# Patient Record
Sex: Female | Born: 1995
Health system: Southern US, Community
[De-identification: ages and names within clinical notes are randomized; demographics above are authoritative.]

## PROBLEM LIST (undated history)

## (undated) DIAGNOSIS — N83209 Unspecified ovarian cyst, unspecified side: Secondary | ICD-10-CM

## (undated) DIAGNOSIS — I7300 Raynaud's syndrome without gangrene: Secondary | ICD-10-CM

## (undated) DIAGNOSIS — N200 Calculus of kidney: Secondary | ICD-10-CM

---
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
## 2013-04-07 IMAGING — CR DG SHOULDER 2+V*L*
3 series · 3 of 3 positions shown · non-contrast
Comparison: None.

CLINICAL DATA: Left shoulder pain.  No injury.

EXAM:
LEFT SHOULDER - 2+ VIEW

[w shoulder ap internal left]
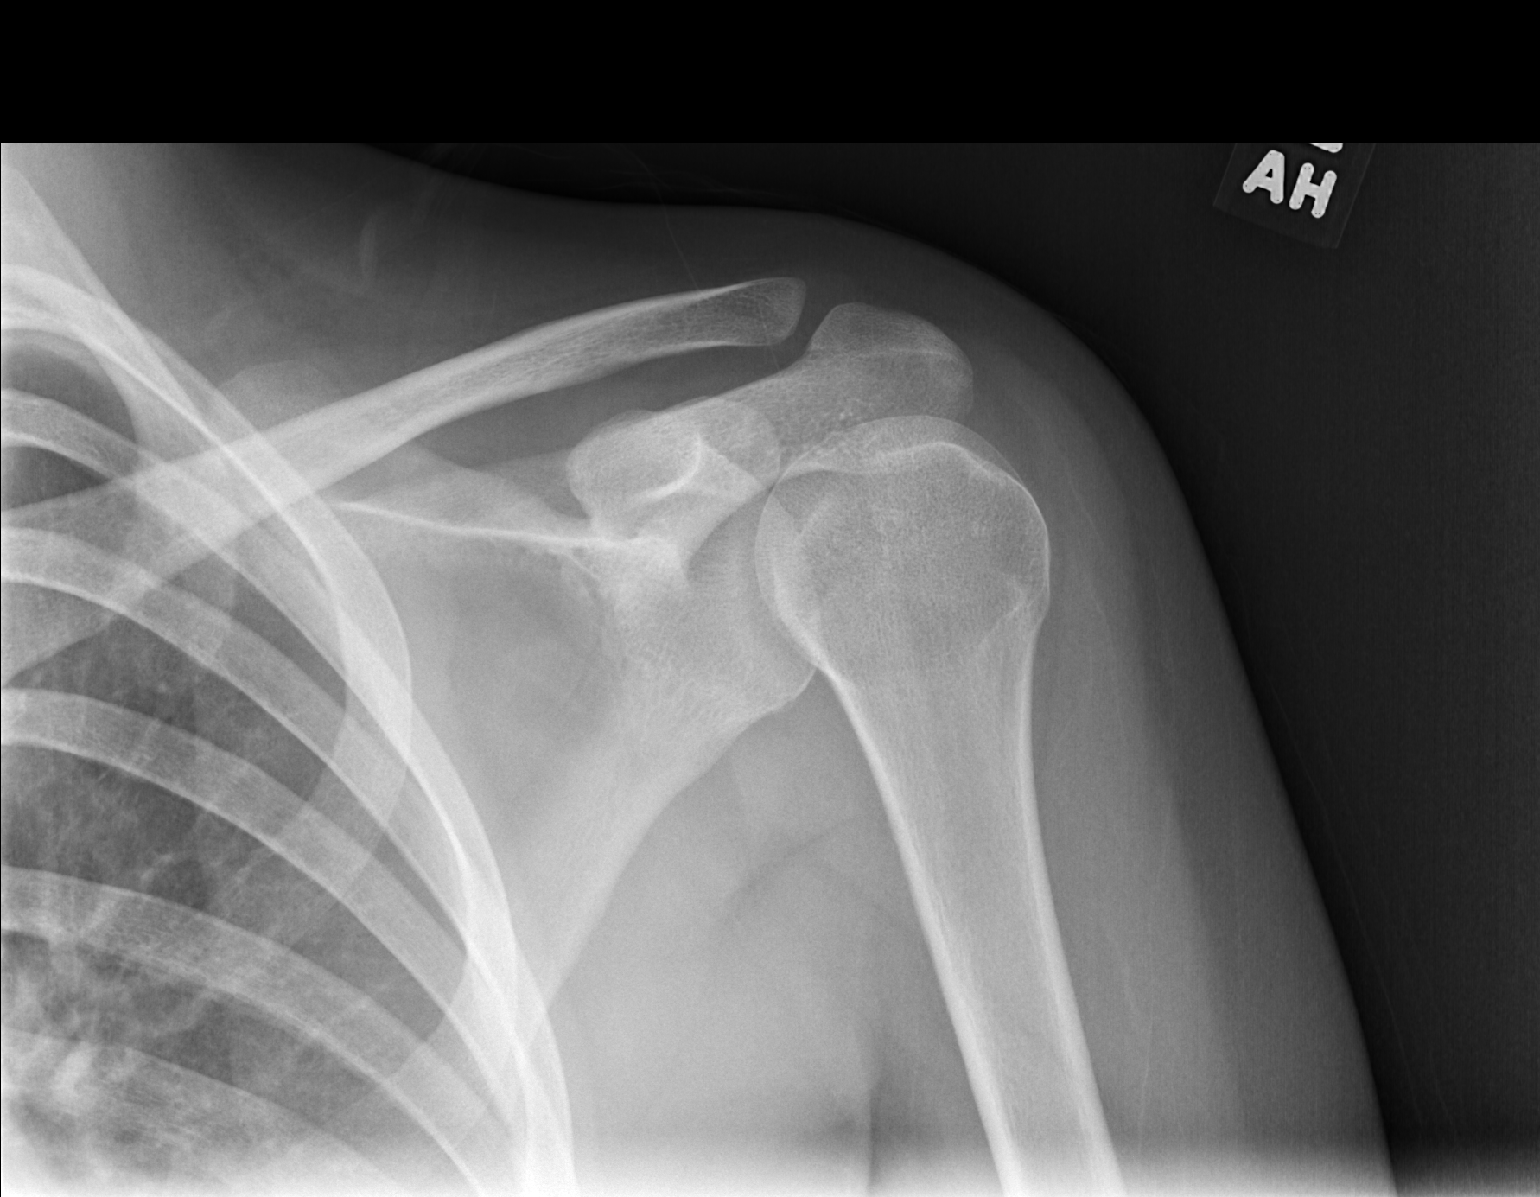

[w shoulder y view left]
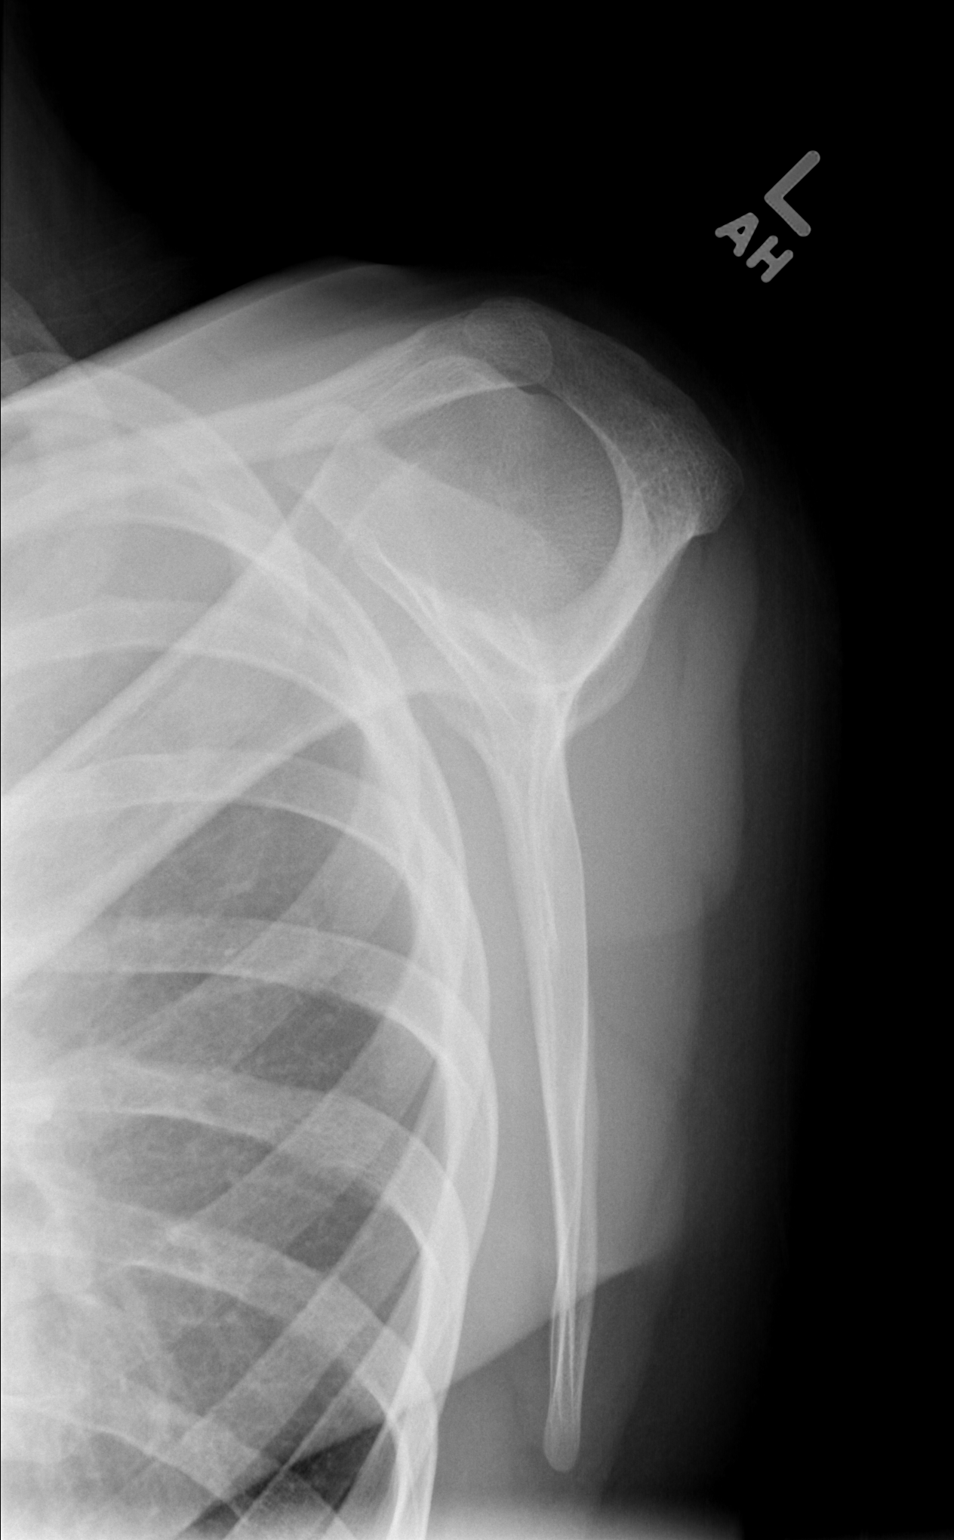

[x shoulder axillary left]
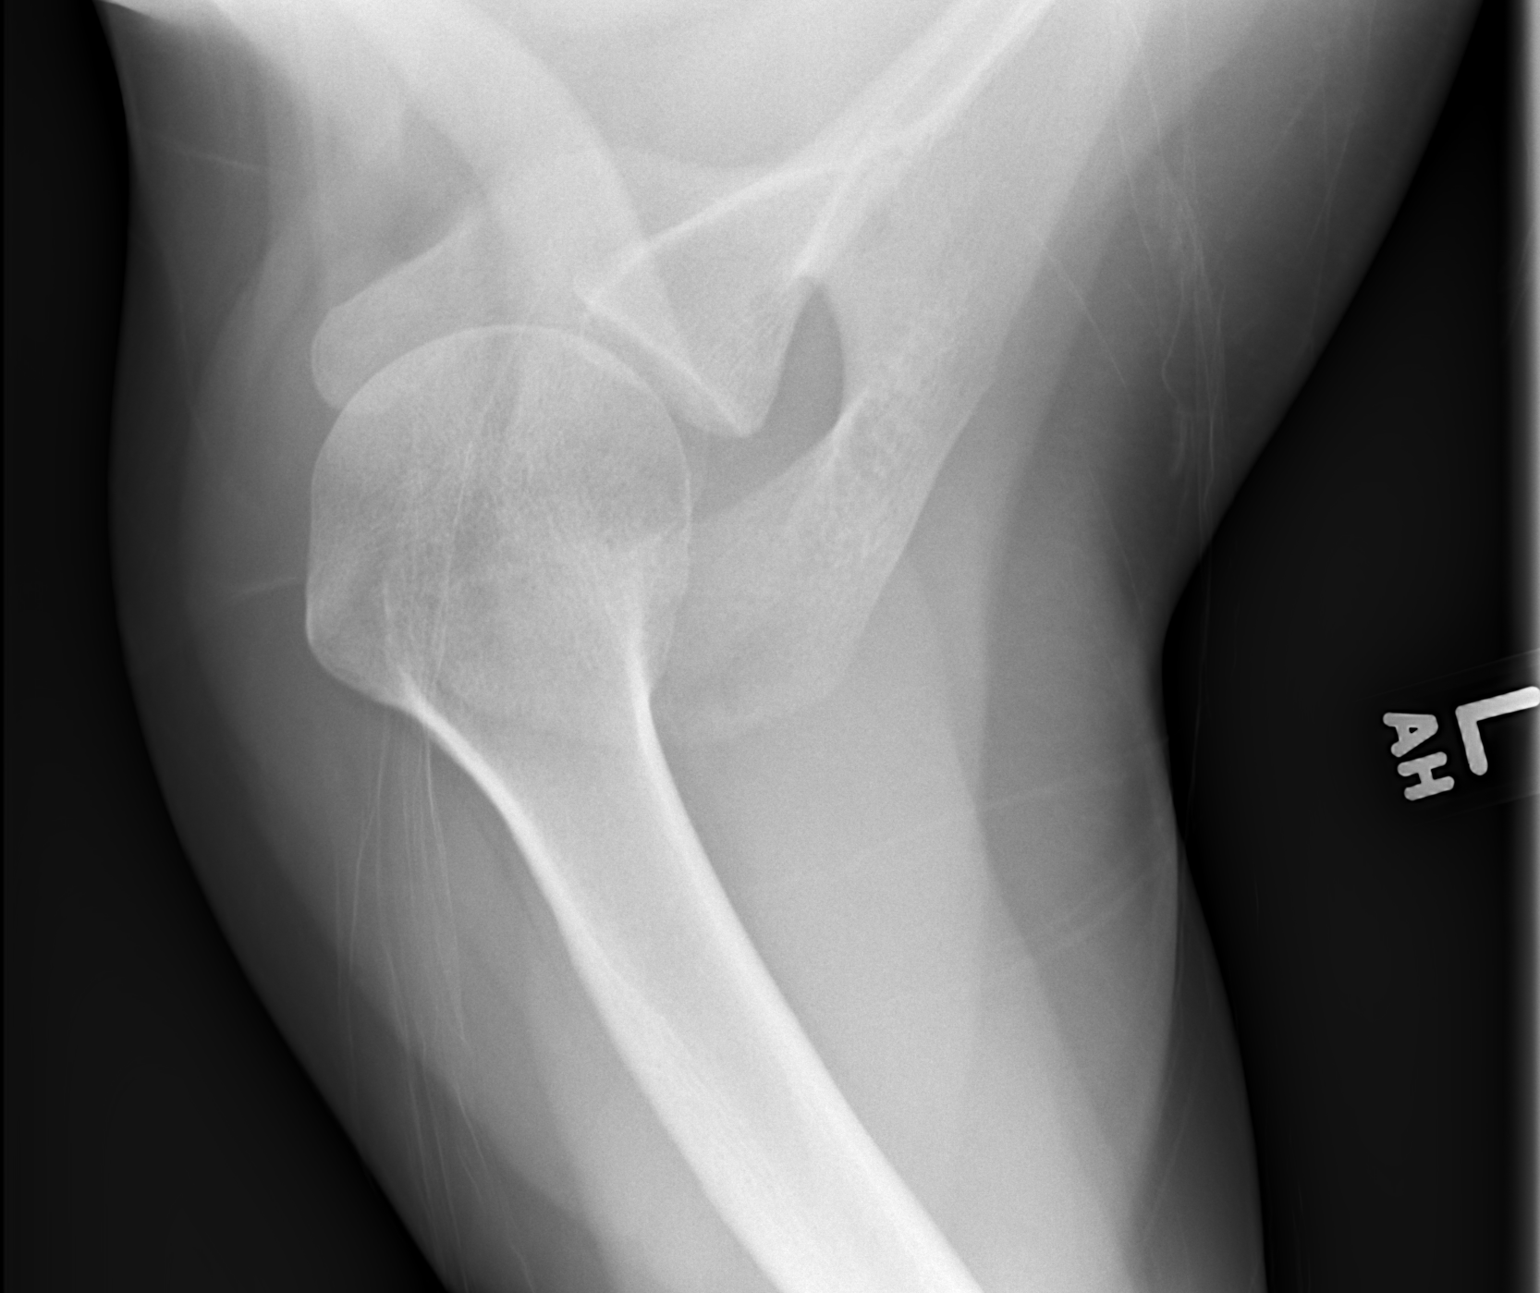

[3 of 3 positions shown; findings below may reference images not displayed]

FINDINGS: There is no evidence of fracture or dislocation. There is no
evidence of arthropathy or other focal bone abnormality. Soft
tissues are unremarkable.
IMPRESSION: Negative.

## 2013-12-05 ENCOUNTER — Emergency Department (HOSPITAL_BASED_OUTPATIENT_CLINIC_OR_DEPARTMENT_OTHER): Payer: BC Managed Care – PPO

## 2013-12-05 ENCOUNTER — Emergency Department (HOSPITAL_BASED_OUTPATIENT_CLINIC_OR_DEPARTMENT_OTHER)
Admission: EM | Admit: 2013-12-05 | Discharge: 2013-12-05 | Disposition: A | Discharge status: Home / Self Care | Payer: BC Managed Care – PPO | Attending: Emergency Medicine | Admitting: Emergency Medicine

## 2013-12-05 ENCOUNTER — Encounter (HOSPITAL_BASED_OUTPATIENT_CLINIC_OR_DEPARTMENT_OTHER): Payer: Self-pay | Admitting: Emergency Medicine

## 2013-12-05 DIAGNOSIS — Z87442 Personal history of urinary calculi: Secondary | ICD-10-CM | POA: Diagnosis not present

## 2013-12-05 DIAGNOSIS — M25512 Pain in left shoulder: Secondary | ICD-10-CM | POA: Diagnosis not present

## 2013-12-05 DIAGNOSIS — M79602 Pain in left arm: Secondary | ICD-10-CM | POA: Diagnosis present

## 2013-12-05 DIAGNOSIS — M542 Cervicalgia: Secondary | ICD-10-CM | POA: Diagnosis not present

## 2013-12-05 DIAGNOSIS — Z720 Tobacco use: Secondary | ICD-10-CM | POA: Diagnosis not present

## 2013-12-05 HISTORY — DX: Calculus of kidney: N20.0

## 2013-12-05 MED ORDER — HYDROCODONE-ACETAMINOPHEN 5-325 MG PO TABS
1.0000 | ORAL_TABLET | Freq: Once | ORAL | Status: AC
  Administered 2013-12-05: 1 via ORAL
  Filled 2013-12-05: qty 1

## 2013-12-05 MED ORDER — IBUPROFEN 800 MG PO TABS: 800.0000 mg | ORAL_TABLET | Freq: Three times a day (TID) | ORAL | Status: DC

## 2013-12-05 MED ORDER — HYDROCODONE-ACETAMINOPHEN 5-325 MG PO TABS: 1.0000 | ORAL_TABLET | ORAL | Status: DC | PRN

## 2013-12-05 NOTE — ED Notes (Signed)
Patient transported to X-ray 

## 2013-12-05 NOTE — ED Notes (Signed)
C/o pain to left shoulder and arm and upper back x 1 week-denies injury

## 2013-12-05 NOTE — ED Provider Notes (Signed)
Medical screening examination/treatment/procedure(s) were performed by non-physician practitioner and as supervising physician I was immediately available for consultation/collaboration.   EKG Interpretation None        Blanka Rockholt, MD 12/05/13 2334 

## 2013-12-05 NOTE — Discharge Instructions (Signed)
Shoulder Sprain °A shoulder sprain is the result of damage to the tough, fiber-like tissues (ligaments) that help hold your shoulder in place. The ligaments may be stretched or torn. Besides the main shoulder joint (the ball and socket), there are several smaller joints that connect the bones in this area. A sprain usually involves one of those joints. Most often it is the acromioclavicular (or AC) joint. That is the joint that connects the collarbone (clavicle) and the shoulder blade (scapula) at the top point of the shoulder blade (acromion). °A shoulder sprain is a mild form of what is called a shoulder separation. Recovering from a shoulder sprain may take some time. For some, pain lingers for several months. Most people recover without long term problems. °CAUSES  °· A shoulder sprain is usually caused by some kind of trauma. This might be: °¨ Falling on an outstretched arm. °¨ Being hit hard on the shoulder. °¨ Twisting the arm. °· Shoulder sprains are more likely to occur in people who: °¨ Play sports. °¨ Have balance or coordination problems. °SYMPTOMS  °· Pain when you move your shoulder. °· Limited ability to move the shoulder. °· Swelling and tenderness on top of the shoulder. °· Redness or warmth in the shoulder. °· Bruising. °· A change in the shape of the shoulder. °DIAGNOSIS  °Your healthcare provider may: °· Ask about your symptoms. °· Ask about recent activity that might have caused those symptoms. °· Examine your shoulder. You may be asked to do simple exercises to test movement. The other shoulder will be examined for comparison. °· Order some tests that provide a look inside the body. They can show the extent of the injury. The tests could include: °¨ X-rays. °¨ CT (computed tomography) scan. °¨ MRI (magnetic resonance imaging) scan. °RISKS AND COMPLICATIONS °· Loss of full shoulder motion. °· Ongoing shoulder pain. °TREATMENT  °How long it takes to recover from a shoulder sprain depends on how  severe it was. Treatment options may include: °· Rest. You should not use the arm or shoulder until it heals. °· Ice. For 2 or 3 days after the injury, put an ice pack on the shoulder up to 4 times a day. It should stay on for 15 to 20 minutes each time. Wrap the ice in a towel so it does not touch your skin. °· Over-the-counter medicine to relieve pain. °· A sling or brace. This will keep the arm still while the shoulder is healing. °· Physical therapy or rehabilitation exercises. These will help you regain strength and motion. Ask your healthcare provider when it is OK to begin these exercises. °· Surgery. The need for surgery is rare with a sprained shoulder, but some people may need surgery to keep the joint in place and reduce pain. °HOME CARE INSTRUCTIONS  °· Ask your healthcare provider about what you should and should not do while your shoulder heals. °· Make sure you know how to apply ice to the correct area of your shoulder. °· Talk with your healthcare provider about which medications should be used for pain and swelling. °· If rehabilitation therapy will be needed, ask your healthcare provider to refer you to a therapist. If it is not recommended, then ask about at-home exercises. Find out when exercise should begin. °SEEK MEDICAL CARE IF:  °Your pain, swelling, or redness at the joint increases. °SEEK IMMEDIATE MEDICAL CARE IF:  °· You have a fever. °· You cannot move your arm or shoulder. °Document Released: 07/09/2008 Document   Revised: 05/15/2011 Document Reviewed: 07/09/2008 [employer] Patient Information 2015 [place], [place]. This information is not intended to replace advice given to you by your health care provider. Make sure you discuss any questions you have with your health care provider. Cryotherapy Cryotherapy means treatment with cold. Ice or gel packs can be used to reduce both pain and swelling. Ice is the most helpful within the first 24 to 48 hours after an injury or flare-up from  overusing a muscle or joint. Sprains, strains, spasms, burning pain, shooting pain, and aches can all be eased with ice. Ice can also be used when recovering from surgery. Ice is effective, has very few side effects, and is safe for most people to use. PRECAUTIONS  Ice is not a safe treatment option for people with:  Raynaud phenomenon. This is a condition affecting small blood vessels in the extremities. Exposure to cold may cause your problems to return.  Cold hypersensitivity. There are many forms of cold hypersensitivity, including:  Cold urticaria. Red, itchy hives appear on the skin when the tissues begin to warm after being iced.  Cold erythema. This is a red, itchy rash caused by exposure to cold.  Cold hemoglobinuria. Red blood cells break down when the tissues begin to warm after being iced. The hemoglobin that carry oxygen are passed into the urine because they cannot combine with blood proteins fast enough.  Numbness or altered sensitivity in the area being iced. If you have any of the following conditions, do not use ice until you have discussed cryotherapy with your caregiver:  Heart conditions, such as arrhythmia, angina, or chronic heart disease.  High blood pressure.  Healing wounds or open skin in the area being iced.  Current infections.  Rheumatoid arthritis.  Poor circulation.  Diabetes. Ice slows the blood flow in the region it is applied. This is beneficial when trying to stop inflamed tissues from spreading irritating chemicals to surrounding tissues. However, if you expose your skin to cold temperatures for too long or without the proper protection, you can damage your skin or nerves. Watch for signs of skin damage due to cold. HOME CARE INSTRUCTIONS Follow these tips to use ice and cold packs safely.  Place a dry or damp towel between the ice and skin. A damp towel will cool the skin more quickly, so you may need to shorten the time that the ice is  used.  For a more rapid response, add gentle compression to the ice.  Ice for no more than 10 to 20 minutes at a time. The bonier the area you are icing, the less time it will take to get the benefits of ice.  Check your skin after 5 minutes to make sure there are no signs of a poor response to cold or skin damage.  Rest 20 minutes or more between uses.  Once your skin is numb, you can end your treatment. You can test numbness by very lightly touching your skin. The touch should be so light that you do not see the skin dimple from the pressure of your fingertip. When using ice, most people will feel these normal sensations in this order: cold, burning, aching, and numbness.  Do not use ice on someone who cannot communicate their responses to pain, such as small children or people with dementia. HOW TO MAKE AN ICE PACK Ice packs are the most common way to use ice therapy. Other methods include ice massage, ice baths, and cryosprays. Muscle creams that cause a  cold, tingly feeling do not offer the same benefits that ice offers and should not be used as a substitute unless recommended by your caregiver. To make an ice pack, do one of the following:  Place crushed ice or a bag of frozen vegetables in a sealable plastic bag. Squeeze out the excess air. Place this bag inside another plastic bag. Slide the bag into a pillowcase or place a damp towel between your skin and the bag.  Mix 3 parts water with 1 part rubbing alcohol. Freeze the mixture in a sealable plastic bag. When you remove the mixture from the freezer, it will be slushy. Squeeze out the excess air. Place this bag inside another plastic bag. Slide the bag into a pillowcase or place a damp towel between your skin and the bag. SEEK MEDICAL CARE IF:  You develop white spots on your skin. This may give the skin a blotchy (mottled) appearance.  Your skin turns blue or pale.  Your skin becomes waxy or hard.  Your swelling gets  worse. MAKE SURE YOU:   Understand these instructions.  Will watch your condition.  Will get help right away if you are not doing well or get worse. Document Released: 10/17/2010 Document Revised: 07/07/2013 Document Reviewed: 10/17/2010 [facility] Patient Information 2015 [place]. This information is not intended to replace advice given to you by your health care provider. Make sure you discuss any questions you have with your health care provider.

## 2013-12-05 NOTE — ED Provider Notes (Signed)
CSN: [id number]     Arrival date & time 12/05/13  1914 History   First MD Initiated Contact with Patient 12/05/13 1935     Chief Complaint  Patient presents with  . Arm Pain     (Consider location/radiation/quality/duration/timing/severity/associated sxs/prior Treatment) Patient is a 18 y.o. female presenting with arm pain. The history is provided by the patient. No language interpreter was used.  Arm Pain This is a new problem. The current episode started 1 to 4 weeks ago. The problem occurs constantly. The problem has been gradually worsening. Pertinent negatives include no chills, fever or weakness. Associated symptoms comments: Left shoulder pain without known injury for the past 1 week. No redness or fever. The pain is worse in certain positions. No weakness of the left UE. Pain extends from the clavicle and superior shoulder to the posterior shoulder. She denies midline neck pain..    Past Medical History  Diagnosis Date  . Kidney stone    History reviewed. No pertinent past surgical history. No family history on file. History  Substance Use Topics  . Smoking status: Current Every Day Smoker  . Smokeless tobacco: Not on file  . Alcohol Use: No   OB History   Grav Para Term Preterm Abortions TAB SAB Ect Mult Living                 Review of Systems  Constitutional: Negative for fever and chills.  HENT: Negative.   Musculoskeletal:       See HPI.  Skin: Negative.  Negative for color change and wound.  Neurological: Negative.  Negative for weakness.      Allergies  Codeine and Sulfa antibiotics  Home Medications   Prior to Admission medications   Not on File   BP 132/84  Pulse 86  Temp(Src) 98.7 F (37.1 C) (Oral)  Resp 18  Ht 5\' 8"  (1.727 m)  Wt 170 lb (77.111 kg)  BMI 25.85 kg/m2  SpO2 100%  LMP 11/15/2013 Physical Exam  Constitutional: She is oriented to person, place, and time. She appears well-developed and well-nourished. No distress.   Cardiovascular: Intact distal pulses.   Pulmonary/Chest: Effort normal. She exhibits no tenderness.  Musculoskeletal:  Left shoulder unremarkable in appearance without swelling or discoloration. No bony deformities. Tender generally: including clavicle, AC and deltoid, posterior shoulder and left paracervical region.   Neurological: She is alert and oriented to person, place, and time. Coordination normal.  Skin: Skin is warm and dry.    ED Course  Procedures (including critical care time) Labs Review Labs Reviewed - No data to display  Imaging Review No results found.   EKG Interpretation None      MDM   Final diagnoses:  None    Left shoulder pain  Negative imaging of painful shoulder without known injury. No objective findings of swelling or deformity. Will provide pain management and ortho follow up.    Arnoldo HookerShari A Kailly Richoux, PA-C 12/05/13 2127

## 2014-09-06 ENCOUNTER — Emergency Department (HOSPITAL_BASED_OUTPATIENT_CLINIC_OR_DEPARTMENT_OTHER)
Admission: EM | Admit: 2014-09-06 | Discharge: 2014-09-06 | Disposition: A | Discharge status: Home / Self Care | Payer: BLUE CROSS/BLUE SHIELD | Attending: Emergency Medicine | Admitting: Emergency Medicine

## 2014-09-06 ENCOUNTER — Encounter (HOSPITAL_BASED_OUTPATIENT_CLINIC_OR_DEPARTMENT_OTHER): Payer: Self-pay | Admitting: *Deleted

## 2014-09-06 ENCOUNTER — Emergency Department (HOSPITAL_BASED_OUTPATIENT_CLINIC_OR_DEPARTMENT_OTHER): Payer: BLUE CROSS/BLUE SHIELD

## 2014-09-06 DIAGNOSIS — Z8679 Personal history of other diseases of the circulatory system: Secondary | ICD-10-CM | POA: Diagnosis not present

## 2014-09-06 DIAGNOSIS — F17210 Nicotine dependence, cigarettes, uncomplicated: Secondary | ICD-10-CM | POA: Insufficient documentation

## 2014-09-06 DIAGNOSIS — Z87442 Personal history of urinary calculi: Secondary | ICD-10-CM | POA: Insufficient documentation

## 2014-09-06 DIAGNOSIS — R1032 Left lower quadrant pain: Secondary | ICD-10-CM | POA: Insufficient documentation

## 2014-09-06 DIAGNOSIS — Z3490 Encounter for supervision of normal pregnancy, unspecified, unspecified trimester: Secondary | ICD-10-CM

## 2014-09-06 DIAGNOSIS — O9989 Other specified diseases and conditions complicating pregnancy, childbirth and the puerperium: Secondary | ICD-10-CM | POA: Diagnosis not present

## 2014-09-06 DIAGNOSIS — Z3A01 Less than 8 weeks gestation of pregnancy: Secondary | ICD-10-CM | POA: Diagnosis not present

## 2014-09-06 DIAGNOSIS — O99331 Smoking (tobacco) complicating pregnancy, first trimester: Secondary | ICD-10-CM | POA: Diagnosis not present

## 2014-09-06 DIAGNOSIS — Z3201 Encounter for pregnancy test, result positive: Secondary | ICD-10-CM

## 2014-09-06 DIAGNOSIS — R1030 Lower abdominal pain, unspecified: Secondary | ICD-10-CM

## 2014-09-06 HISTORY — DX: Unspecified ovarian cyst, unspecified side: N83.209

## 2014-09-06 HISTORY — DX: Raynaud's syndrome without gangrene: I73.00

## 2014-09-06 LAB — BASIC METABOLIC PANEL
Anion gap: 8 (ref 5–15)
BUN: 7 mg/dL (ref 6–20)
CALCIUM: 9.4 mg/dL (ref 8.9–10.3)
CO2: 25 mmol/L (ref 22–32)
CREATININE: 0.7 mg/dL (ref 0.44–1.00)
Chloride: 105 mmol/L (ref 101–111)
GFR calc non Af Amer: >60 mL/min (ref >60)
GLUCOSE: 90 mg/dL (ref 65–99)
Potassium: 3.4 mmol/L — ABNORMAL LOW (ref 3.5–5.1)
Sodium: 138 mmol/L (ref 135–145)

## 2014-09-06 LAB — CBC WITH DIFFERENTIAL/PLATELET
BASOS ABS: 0 10*3/uL (ref 0.0–0.1)
BASOS PCT: 0 % (ref 0–1)
Eosinophils Absolute: 0.1 10*3/uL (ref 0.0–0.7)
Eosinophils Relative: 2 % (ref 0–5)
HEMATOCRIT: 39 % (ref 36.0–46.0)
Hemoglobin: 13.2 g/dL (ref 12.0–15.0)
Lymphocytes Relative: 33 % (ref 12–46)
Lymphs Abs: 2.1 10*3/uL (ref 0.7–4.0)
MCH: 30 pg (ref 26.0–34.0)
MCHC: 33.8 g/dL (ref 30.0–36.0)
MCV: 88.6 fL (ref 78.0–100.0)
Monocytes Absolute: 0.5 10*3/uL (ref 0.1–1.0)
Monocytes Relative: 7 % (ref 3–12)
NEUTROS ABS: 3.8 10*3/uL (ref 1.7–7.7)
Neutrophils Relative %: 58 % (ref 43–77)
PLATELETS: 234 10*3/uL (ref 150–400)
RBC: 4.4 MIL/uL (ref 3.87–5.11)
RDW: 12.1 % (ref 11.5–15.5)
WBC: 6.5 10*3/uL (ref 4.0–10.5)

## 2014-09-06 LAB — URINALYSIS, ROUTINE W REFLEX MICROSCOPIC
Bilirubin Urine: NEGATIVE
Glucose, UA: NEGATIVE mg/dL
Hgb urine dipstick: NEGATIVE
Ketones, ur: NEGATIVE mg/dL
Nitrite: NEGATIVE
Protein, ur: NEGATIVE mg/dL
Specific Gravity, Urine: 1.019 (ref 1.005–1.030)
Urobilinogen, UA: 0.2 mg/dL (ref 0.0–1.0)
pH: 6 (ref 5.0–8.0)

## 2014-09-06 LAB — URINE MICROSCOPIC-ADD ON

## 2014-09-06 LAB — PREGNANCY, URINE: PREG TEST UR: POSITIVE — AB

## 2014-09-06 LAB — HCG, QUANTITATIVE, PREGNANCY: HCG, BETA CHAIN, QUANT, S: 172 m[IU]/mL — AB (ref <5)

## 2014-09-06 IMAGING — US US OB TRANSVAGINAL
1 series · 13 of 28 positions shown · non-contrast
Comparison: None.

CLINICAL DATA: First trimester pregnancy. Positive pregnancy test.
Quantitative beta HCG is 172. Cramping.

EXAM:
OBSTETRIC <14 WK US AND TRANSVAGINAL OB US
TECHNIQUE: Both transabdominal and transvaginal ultrasound examinations were
performed for complete evaluation of the gestation as well as the
maternal uterus, adnexal regions, and pelvic cul-de-sac.
Transvaginal technique was performed to assess early pregnancy.

[Series 1: us ob transvaginal · 0.18mm/px · 13 of 85 slices shown]
[im 4/85]
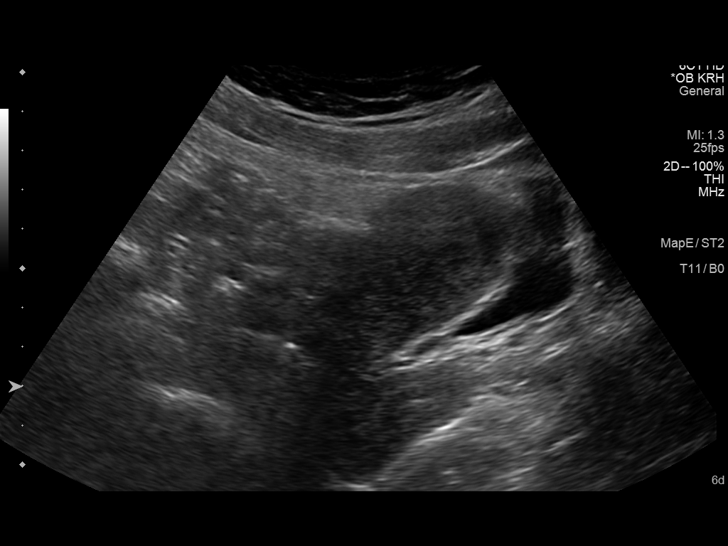
[im 10/85]
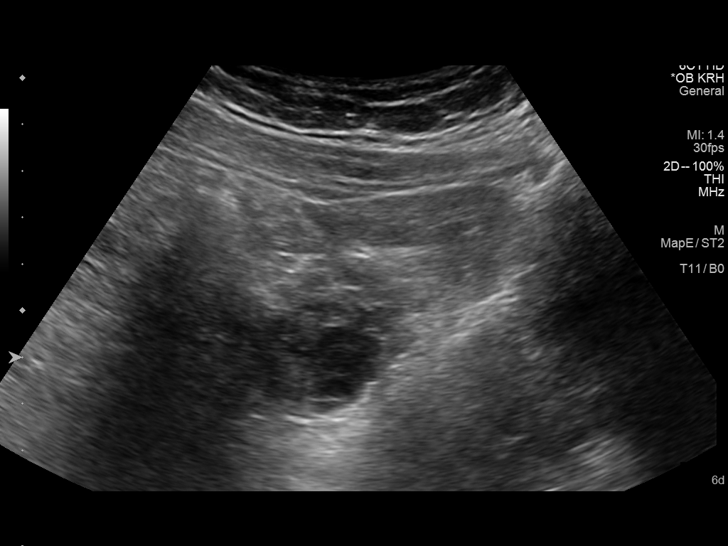
[im 16/85]
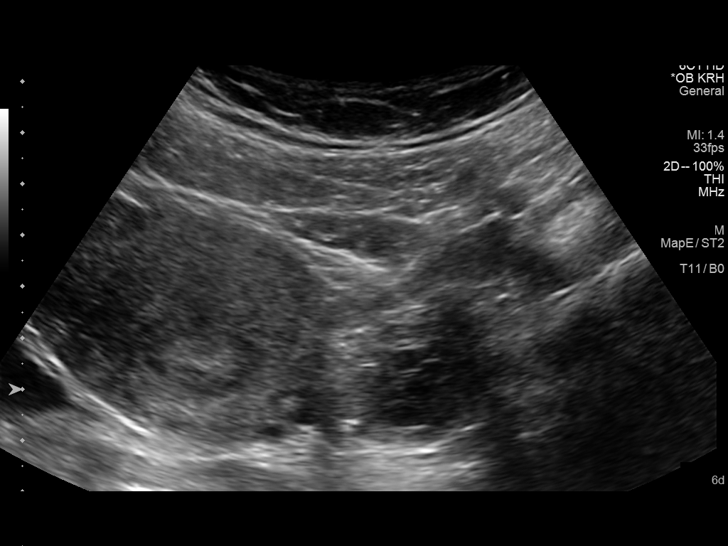
[im 22/85]
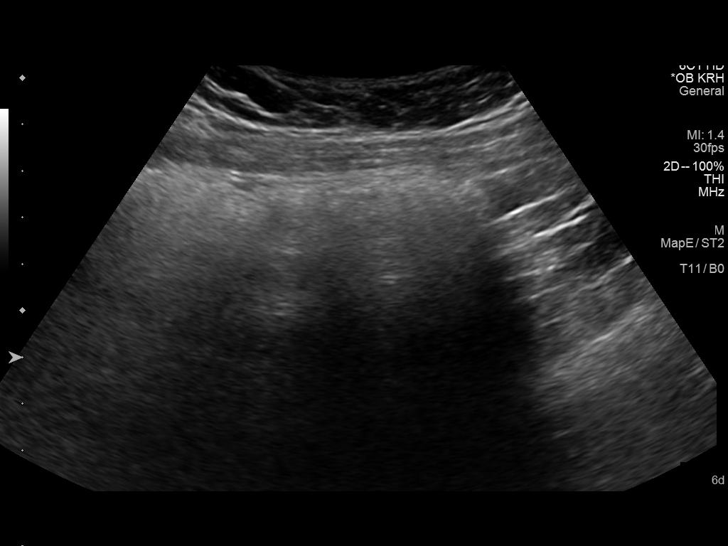
[im 29/85]
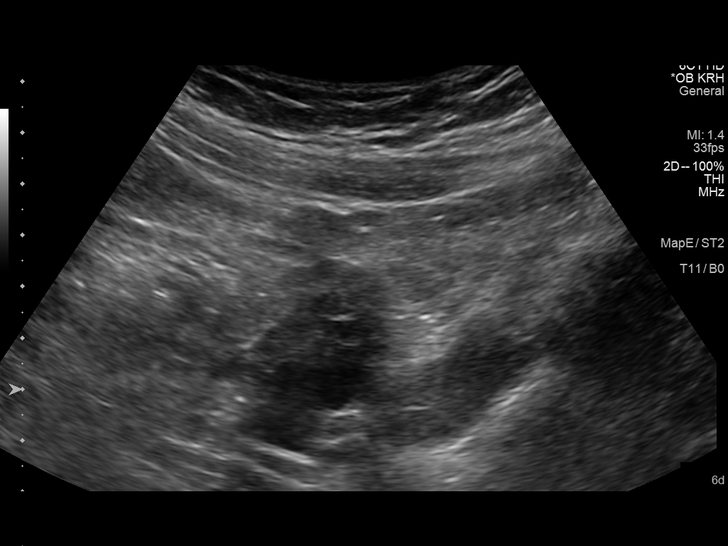
[im 35/85]
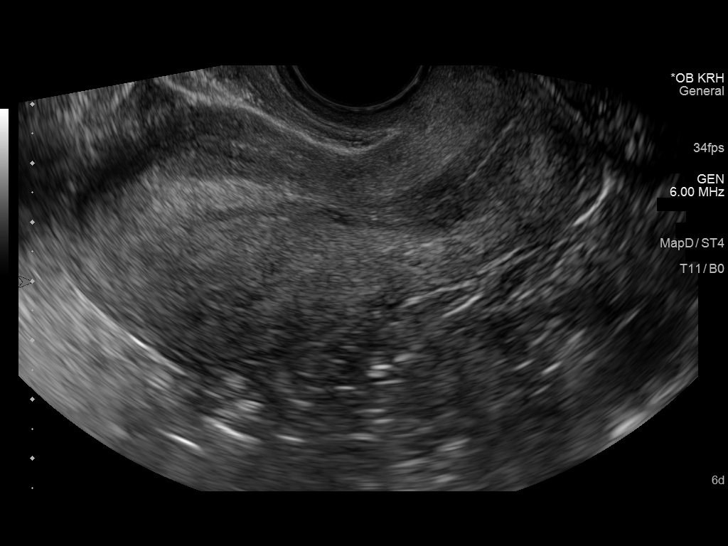
[im 44/85]
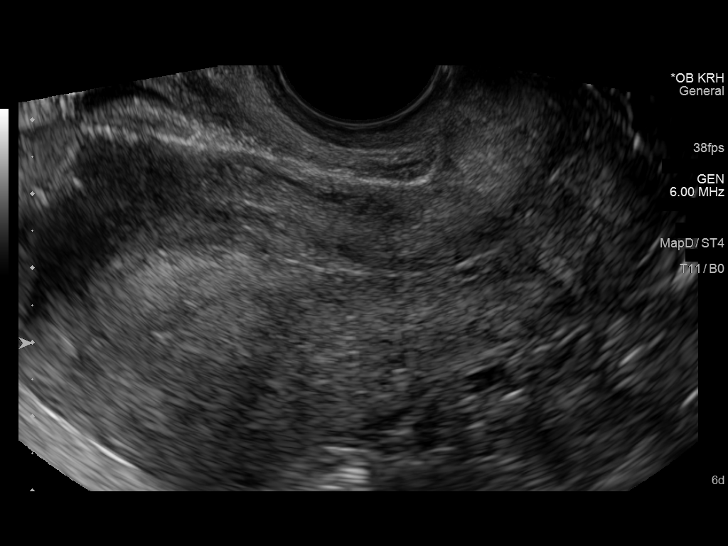
[im 50/85]
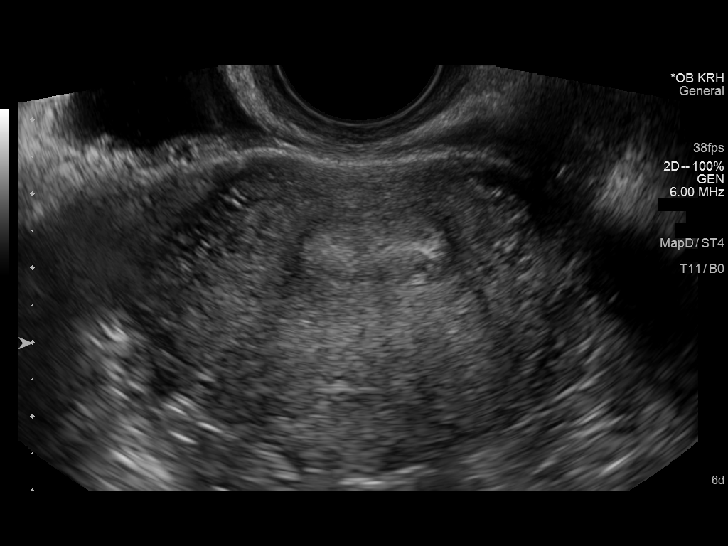
[im 57/85]
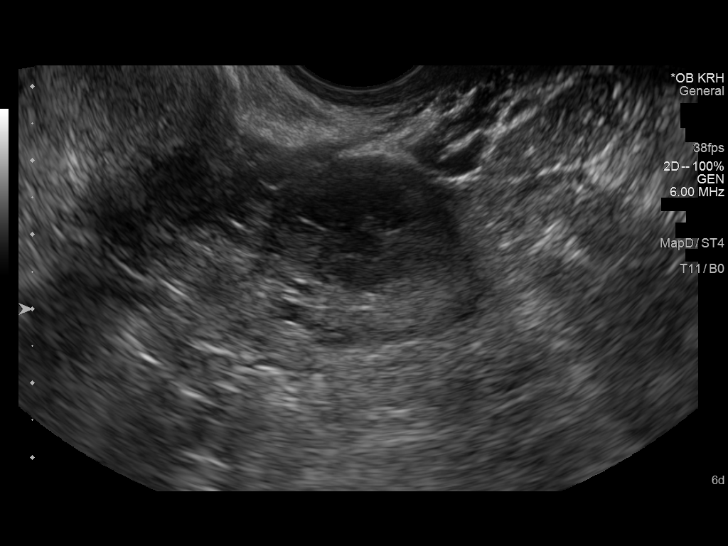
[im 63/85]
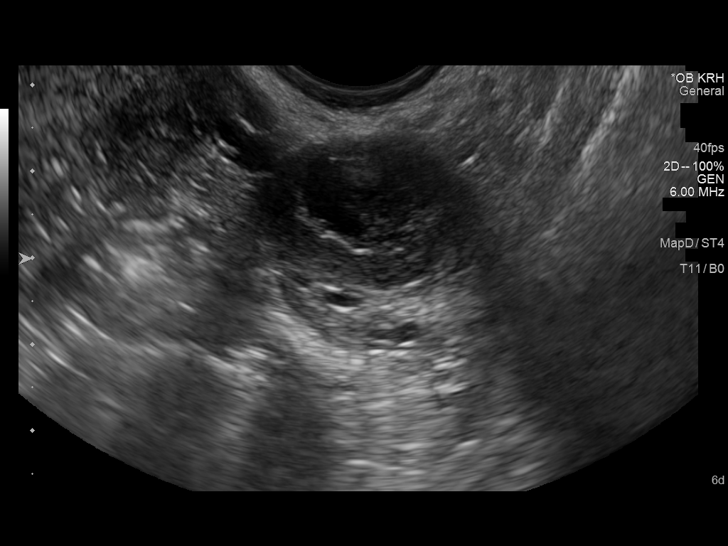
[im 69/85]
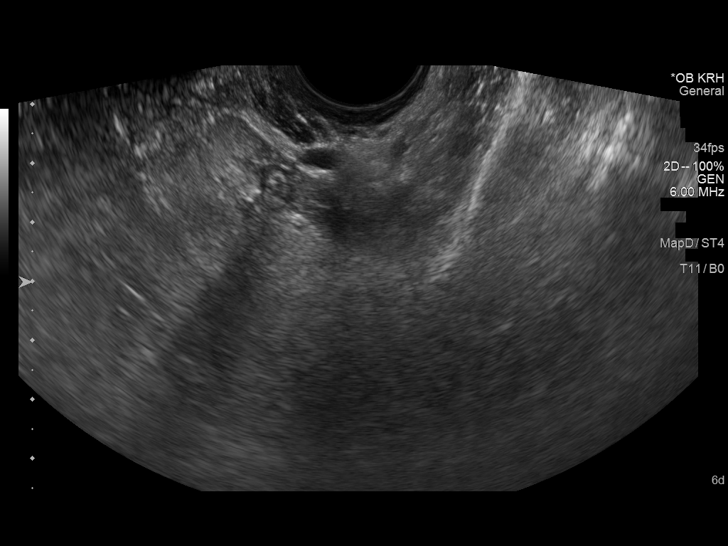
[im 75/85]
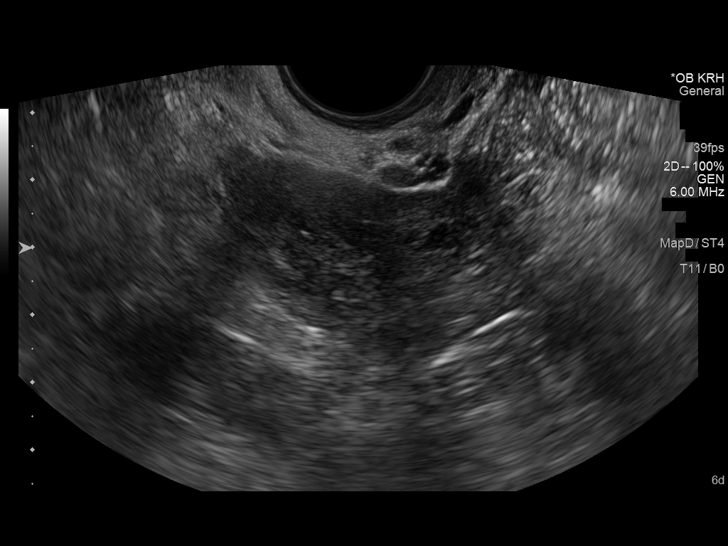
[im 81/85]
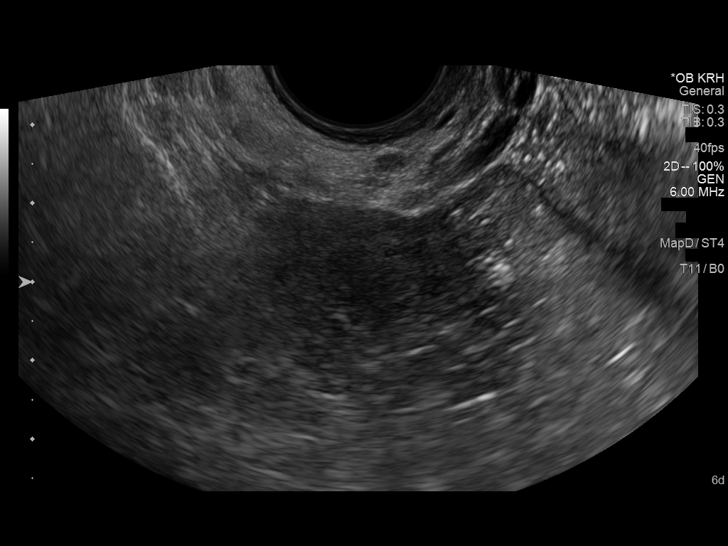

[13 of 28 positions shown; findings below may reference images not displayed]

FINDINGS: Intrauterine gestational sac: None visualized.

Maternal uterus/adnexae: Uterus is of normal size and echotexture.
It is anteflexed.

The right ovary is of normal size and echotexture measuring 3.8 x
2.2 x 2.9 cm.

The left ovary measures 3.6 x 2.8 x 2.9 cm. The complex hypoechoic
area within the left ovary measures 2.4 x 1.7 x 1.8 cm.

A small amount of free fluid is noted.
IMPRESSION: 1. No intrauterine pregnancy evident. With the quantitative beta HCG
of 172, a normal early pregnancy could be present. Follow-up
ultrasound may be warranted as clinically indicated.
2. Complex hypoechoic lesion in the left ovary measures 2.4 x 1.7 x
1.8 cm. This could represent a corpus luteal cyst. Ectopic pregnancy
is considered less likely given the appearance and quantitative beta
HCG is well is the recent menstrual period.

## 2014-09-06 MED ORDER — MYNATAL PO CAPS: 1.0000 | ORAL_CAPSULE | Freq: Every day | ORAL | Status: DC

## 2014-09-06 NOTE — ED Notes (Signed)
Patient transported to Ultrasound via stretcher, sr x 2 up 

## 2014-09-06 NOTE — ED Notes (Signed)
Returned from u/s

## 2014-09-06 NOTE — ED Notes (Signed)
Pt reports that she had a positive pregnancy test yesterday.  Reports chronic abdominal cramping over the past several months.  LMP 1 month ago.

## 2014-09-06 NOTE — ED Notes (Signed)
MD at bedside to discuss results of testing. 

## 2014-09-06 NOTE — ED Provider Notes (Signed)
CSN: [id number]     Arrival date & time 09/06/14  1546 History  This chart was scribed for  Arby Barrette, MD by Bethel Born, ED Scribe. This patient was seen in room MH07/MH07 and the patient's care was started at 4:08 PM.    Chief Complaint  Patient presents with  . Abdominal Cramping    Patient is a 19 y.o. female presenting with cramps. The history is provided by the patient. No language interpreter was used.  Abdominal Cramping   Brittany Harrison is a 19 y.o. female with PMHx of ruptured ovarian cyst who presents to the Emergency Department complaining of left sided abdominal pain  with onset several months ago. The pain is described as cramping and rated 2/10 in severity. The cramping has not increased but the pt had a positive home pregnancy test yesterday and is now concerned. Associated symptoms include breast pain, nausea, feeling hot, increased urinary frequency, and missed menstrual period. Pt denies vaginal bleeding and dysuria. She reports she had a follow-up with her gynecologist approximately June 17, at that time she reports she had a negative pregnancy test and her ultrasound showed the left cyst without any significant changes. She took several pregnancy tests yesterday because she had not started her menstrual cycle and had developed some symptoms of breast tenderness and nausea. She reports all the tests she took yesterday came back positive. She reports that she is going to the beach now and wants to make sure that everything is okay if she is pregnant as she suspects she is. She has not had any abnormal vaginal discharge and the quality of pain she had been experiencing for several months with her ovarian cyst has not changed or worsened.   Past Medical History  Diagnosis Date  . Kidney stone   . Ovarian cyst   . Raynaud disease    History reviewed. No pertinent past surgical history. History reviewed. No pertinent family history. History  Substance Use Topics  .  Smoking status: Current Every Day Smoker -- 0.50 packs/day    Types: Cigarettes  . Smokeless tobacco: Not on file  . Alcohol Use: No   OB History    Gravida Para Term Preterm AB TAB SAB Ectopic Multiple Living   1              Review of Systems 10 Systems reviewed and all are negative for acute change except as noted in the HPI.  Allergies  Codeine and Sulfa antibiotics  Home Medications   Prior to Admission medications   Medication Sig Start Date End Date Taking? Authorizing Provider  Prenatal Multivit-Min-Fe-FA [facility]) CAPS Take 1 capsule by mouth daily. 09/06/14   Arby Barrette, MD   Triage Vitals: BP 126/73 mmHg  Pulse 84  Resp 18  SpO2 100%  LMP 08/10/2014 Physical Exam  Constitutional: She is oriented to person, place, and time. She appears well-developed and well-nourished.  HENT:  Head: Normocephalic and atraumatic.  Eyes: EOM are normal. Pupils are equal, round, and reactive to light.  Neck: Neck supple.  Cardiovascular: Normal rate, regular rhythm, normal heart sounds and intact distal pulses.   Pulmonary/Chest: Effort normal and breath sounds normal.  Abdominal: Soft. Bowel sounds are normal. She exhibits no distension. There is tenderness.  Minimal left lower quadrant tenderness to deep palpation. No guarding.  Musculoskeletal: Normal range of motion. She exhibits no edema.  Neurological: She is alert and oriented to person, place, and time. She has normal strength. Coordination normal. GCS eye  subscore is 4. GCS verbal subscore is 5. GCS motor subscore is 6.  Skin: Skin is warm, dry and intact.  Psychiatric: She has a normal mood and affect.    ED Course  Procedures  DIAGNOSTIC STUDIES: Oxygen Saturation is 100% on RA, normal by my interpretation.    COORDINATION OF CARE: 4:09 PM Discussed treatment plan which includes lab work and [place] with pt at bedside and pt agreed to plan.  Labs Review Labs Reviewed  PREGNANCY, URINE - Abnormal; Notable for the  following:    Preg Test, Ur POSITIVE (*)    All other components within normal limits  URINALYSIS, ROUTINE W REFLEX MICROSCOPIC (NOT AT [facility]) - Abnormal; Notable for the following:    Leukocytes, UA MODERATE (*)    All other components within normal limits  BASIC METABOLIC PANEL - Abnormal; Notable for the following:    Potassium 3.4 (*)    All other components within normal limits  HCG, QUANTITATIVE, PREGNANCY - Abnormal; Notable for the following:    hCG, Beta Chain, Quant, S 172 (*)    All other components within normal limits  URINE MICROSCOPIC-ADD ON - Abnormal; Notable for the following:    Squamous Epithelial / LPF FEW (*)    Bacteria, UA FEW (*)    Casts GRANULAR CAST (*)    All other components within normal limits  CBC WITH DIFFERENTIAL/PLATELET    Imaging Review US Ob Comp Less 14 Wks  09/06/2014   CLINICAL DATA:  First trimester pregnancy. Positive pregnancy test. Quantitative beta HCG is 172. Cramping.  EXAM: OBSTETRIC <14 WK [place] AND TRANSVAGINAL OB US  TECHNIQUE: Both transabdominal and transvaginal ultrasound examinations were performed for complete evaluation of the gestation as well as the maternal uterus, adnexal regions, and pelvic cul-de-sac. Transvaginal technique was performed to assess early pregnancy.  COMPARISON:  None.  FINDINGS: Intrauterine gestational sac: None visualized.  Maternal uterus/adnexae: Uterus is of normal size and echotexture. It is anteflexed.  The right ovary is of normal size and echotexture measuring 3.8 x 2.2 x 2.9 cm.  The left ovary measures 3.6 x 2.8 x 2.9 cm. The complex hypoechoic area within the left ovary measures 2.4 x 1.7 x 1.8 cm.  A small amount of free fluid is noted.  IMPRESSION: 1. No intrauterine pregnancy evident. With the quantitative beta HCG of 172, a normal early pregnancy could be present. Follow-up ultrasound may be warranted as clinically indicated. 2. Complex hypoechoic lesion in the left ovary measures 2.4 x 1.7 x 1.8 cm.  This could represent a corpus luteal cyst. Ectopic pregnancy is considered less likely given the appearance and quantitative beta HCG is well is the recent menstrual period.   Electronically Signed   By: Marin Roberts M.D.   On: 09/06/2014 17:26   US Ob Transvaginal  09/06/2014   CLINICAL DATA:  First trimester pregnancy. Positive pregnancy test. Quantitative beta HCG is 172. Cramping.  EXAM: OBSTETRIC <14 WK [place] AND TRANSVAGINAL OB US  TECHNIQUE: Both transabdominal and transvaginal ultrasound examinations were performed for complete evaluation of the gestation as well as the maternal uterus, adnexal regions, and pelvic cul-de-sac. Transvaginal technique was performed to assess early pregnancy.  COMPARISON:  None.  FINDINGS: Intrauterine gestational sac: None visualized.  Maternal uterus/adnexae: Uterus is of normal size and echotexture. It is anteflexed.  The right ovary is of normal size and echotexture measuring 3.8 x 2.2 x 2.9 cm.  The left ovary measures 3.6 x 2.8 x 2.9 cm. The  complex hypoechoic area within the left ovary measures 2.4 x 1.7 x 1.8 cm.  A small amount of free fluid is noted.  IMPRESSION: 1. No intrauterine pregnancy evident. With the quantitative beta HCG of 172, a normal early pregnancy could be present. Follow-up ultrasound may be warranted as clinically indicated. 2. Complex hypoechoic lesion in the left ovary measures 2.4 x 1.7 x 1.8 cm. This could represent a corpus luteal cyst. Ectopic pregnancy is considered less likely given the appearance and quantitative beta HCG is well is the recent menstrual period.   Electronically Signed   By: Marin Robertshristopher  Mattern M.D.   On: 09/06/2014 17:26     EKG Interpretation None      MDM   Final diagnoses:  Early stage of pregnancy   The patient presents as outlined above. Quantitative hCG is in the 700s and ultrasound does not show a pregnancy at this point. Findings and history are consistent with very early pregnancy. She had an  ultrasound done approximately the third week of June identifying the same ovarian cyst as noted today. At this time there is no reason to think she has ectopic pregnancy or a specific complication of pregnancy with no bleeding or discharge. The patient's main concern in coming to emergency department today was that she suspects that she was pregnant and this was confirmed with the test. She is counseled on signs and symptoms of ectopic pregnancy of which she is aware. She is counseled on signs and symptoms were to return. Pregnancy related information is provided discharge instructions with a prenatal vitamin prescription.     Arby BarretteMarcy Philbert Ocallaghan, MD 09/06/14 [phone]

## 2014-09-06 NOTE — Discharge Instructions (Signed)
First Trimester of Pregnancy °The first trimester of pregnancy is from week 1 until the end of week 12 (months 1 through 3). A week after a sperm fertilizes an egg, the egg will implant on the wall of the uterus. This embryo will begin to develop into a baby. Genes from you and your partner are forming the baby. The female genes determine whether the baby is a boy or a girl. At 6-8 weeks, the eyes and face are formed, and the heartbeat can be seen on ultrasound. At the end of 12 weeks, all the baby's organs are formed.  °Now that you are pregnant, you will want to do everything you can to have a healthy baby. Two of the most important things are to get good prenatal care and to follow your health care provider's instructions. Prenatal care is all the medical care you receive before the baby's birth. This care will help prevent, find, and treat any problems during the pregnancy and childbirth. °BODY CHANGES °Your body goes through many changes during pregnancy. The changes vary from woman to woman.  °· You may gain or lose a couple of pounds at first. °· You may feel sick to your stomach (nauseous) and throw up (vomit). If the vomiting is uncontrollable, call your health care provider. °· You may tire easily. °· You may develop headaches that can be relieved by medicines approved by your health care provider. °· You may urinate more often. Painful urination may mean you have a bladder infection. °· You may develop heartburn as a result of your pregnancy. °· You may develop constipation because certain hormones are causing the muscles that push waste through your intestines to slow down. °· You may develop hemorrhoids or swollen, bulging veins (varicose veins). °· Your breasts may begin to grow larger and become tender. Your nipples may stick out more, and the tissue that surrounds them (areola) may become darker. °· Your gums may bleed and may be sensitive to brushing and flossing. °· Dark spots or blotches (chloasma,  mask of pregnancy) may develop on your face. This will likely fade after the baby is born. °· Your menstrual periods will stop. °· You may have a loss of appetite. °· You may develop cravings for certain kinds of food. °· You may have changes in your emotions from day to day, such as being excited to be pregnant or being concerned that something may go wrong with the pregnancy and baby. °· You may have more vivid and strange dreams. °· You may have changes in your hair. These can include thickening of your hair, rapid growth, and changes in texture. Some women also have hair loss during or after pregnancy, or hair that feels dry or thin. Your hair will most likely return to normal after your baby is born. °WHAT TO EXPECT AT YOUR PRENATAL VISITS °During a routine prenatal visit: °· You will be weighed to make sure you and the baby are growing normally. °· Your blood pressure will be taken. °· Your abdomen will be measured to track your baby's growth. °· The fetal heartbeat will be listened to starting around week 10 or 12 of your pregnancy. °· Test results from any previous visits will be discussed. °Your health care provider may ask you: °· How you are feeling. °· If you are feeling the baby move. °· If you have had any abnormal symptoms, such as leaking fluid, bleeding, severe headaches, or abdominal cramping. °· If you have any questions. °Other tests   that may be performed during your first trimester include: °· Blood tests to find your blood type and to check for the presence of any previous infections. They will also be used to check for low iron levels (anemia) and Rh antibodies. Later in the pregnancy, blood tests for diabetes will be done along with other tests if problems develop. °· Urine tests to check for infections, diabetes, or protein in the urine. °· An ultrasound to confirm the proper growth and development of the baby. °· An amniocentesis to check for possible genetic problems. °· Fetal screens for  spina bifida and Down syndrome. °· You may need other tests to make sure you and the baby are doing well. °HOME CARE INSTRUCTIONS  °Medicines °· Follow your health care provider's instructions regarding medicine use. Specific medicines may be either safe or unsafe to take during pregnancy. °· Take your prenatal vitamins as directed. °· If you develop constipation, try taking a stool softener if your health care provider approves. °Diet °· Eat regular, well-balanced meals. Choose a variety of foods, such as meat or vegetable-based protein, fish, milk and low-fat dairy products, vegetables, fruits, and whole grain breads and cereals. Your health care provider will help you determine the amount of weight gain that is right for you. °· Avoid raw meat and uncooked cheese. These carry germs that can cause birth defects in the baby. °· Eating four or five small meals rather than three large meals a day may help relieve nausea and vomiting. If you start to feel nauseous, eating a few soda crackers can be helpful. Drinking liquids between meals instead of during meals also seems to help nausea and vomiting. °· If you develop constipation, eat more high-fiber foods, such as fresh vegetables or fruit and whole grains. Drink enough fluids to keep your urine clear or pale yellow. °Activity and Exercise °· Exercise only as directed by your health care provider. Exercising will help you: °¨ Control your weight. °¨ Stay in shape. °¨ Be prepared for labor and delivery. °· Experiencing pain or cramping in the lower abdomen or low back is a good sign that you should stop exercising. Check with your health care provider before continuing normal exercises. °· Try to avoid standing for long periods of time. Move your legs often if you must stand in one place for a long time. °· Avoid heavy lifting. °· Wear low-heeled shoes, and practice good posture. °· You may continue to have sex unless your health care provider directs you  otherwise. °Relief of Pain or Discomfort °· Wear a good support bra for breast tenderness.   °· Take warm sitz baths to soothe any pain or discomfort caused by hemorrhoids. Use hemorrhoid cream if your health care provider approves.   °· Rest with your legs elevated if you have leg cramps or low back pain. °· If you develop varicose veins in your legs, wear support hose. Elevate your feet for 15 minutes, 3-4 times a day. Limit salt in your diet. °Prenatal Care °· Schedule your prenatal visits by the twelfth week of pregnancy. They are usually scheduled monthly at first, then more often in the last 2 months before delivery. °· Write down your questions. Take them to your prenatal visits. °· Keep all your prenatal visits as directed by your health care provider. °Safety °· Wear your seat belt at all times when driving. °· Make a list of emergency phone numbers, including numbers for family, friends, the hospital, and police and fire departments. °General Tips °·   Ask your health care provider for a referral to a local prenatal education class. Begin classes no later than at the beginning of month 6 of your pregnancy.  Ask for help if you have counseling or nutritional needs during pregnancy. Your health care provider can offer advice or refer you to specialists for help with various needs.  Do not use hot tubs, steam rooms, or saunas.  Do not douche or use tampons or scented sanitary pads.  Do not cross your legs for long periods of time.  Avoid cat litter boxes and soil used by cats. These carry germs that can cause birth defects in the baby and possibly loss of the fetus by miscarriage or stillbirth.  Avoid all smoking, herbs, alcohol, and medicines not prescribed by your health care provider. Chemicals in these affect the formation and growth of the baby.  Schedule a dentist appointment. At home, brush your teeth with a soft toothbrush and be gentle when you floss. SEEK MEDICAL CARE IF:   You have  dizziness.  You have mild pelvic cramps, pelvic pressure, or nagging pain in the abdominal area.  You have persistent nausea, vomiting, or diarrhea.  You have a bad smelling vaginal discharge.  You have pain with urination.  You notice increased swelling in your face, hands, legs, or ankles. SEEK IMMEDIATE MEDICAL CARE IF:   You have a fever.  You are leaking fluid from your vagina.  You have spotting or bleeding from your vagina.  You have severe abdominal cramping or pain.  You have rapid weight gain or loss.  You vomit blood or material that looks like coffee grounds.  You are exposed to [place] and have never had them.  You are exposed to fifth disease or chickenpox.  You develop a severe headache.  You have shortness of breath.  You have any kind of trauma, such as from a fall or a car accident. Document Released: 02/14/2001 Document Revised: 07/07/2013 Document Reviewed: 12/31/2012 [facility] Patient Information 2015 [place], [place]. This information is not intended to replace advice given to you by your health care provider. Make sure you discuss any questions you have with your health care provider. Folic Acid in Pregnancy Folic acid is a B vitamin that helps prevent neural tube defects (NTDs). The neural tube is the part of a developing baby that becomes the brain and spinal cord. When the neural tube does not close properly, a baby is born with an NTD. NTDs include spina bifida, hernia of the spinal cord, and the absence of part or all of the brain (anencephaly).  Take folic acid at least 4 weeks before getting pregnant and through the first 3 months of pregnancy. This is when the neural tube is developing. It is available in most multivitamins, as a folic-acid-only supplement, and in some foods. Taking the right amount of folic acid before conception and during pregnancy lessens the chance of having a baby born with an NTD. Giving folic acid will not affect a  neural tube defect if it is already present. DIAGNOSIS   An alpha fetoprotein (AFP) blood or amniotic fluid test will show high levels of the alpha fetoprotein if a woman is carrying a baby with an NTD. This test is done on all pregnant women in the first trimester.  An ultrasound may detect an NTD. WHAT YOU CAN DO:  Take a multivitamin with at least 0.4 milligrams (400 micrograms) of folic acid daily at least 4 weeks before getting pregnant and through  through the first 12 weeks of pregnancy. °· If you have already had a pregnancy affected by an NTD, take 4 milligrams (4,000 micrograms) of folic acid daily. Take this amount 1 month before you start trying to get pregnant and continue through the first 3 months of pregnancy. °If you have a seizure disorder or take medicines to control seizures, tell your maternity care provider. Continue to take your folic acid unless you are told otherwise.   °FOLIC ACID IN FOODS °Eat a healthy diet that has foods that contain folic acid, the natural form of the vitamin. Such foods include: °· Fortified breakfast cereals. °· Lentils. °· Asparagus. °· Spinach. °· Organ meats (liver). °· Black beans. °· Peanuts (eat only if you do not have a peanut allergy). °· Broccoli. °· Strawberries, oranges. °· Orange juice (from concentrate is best). °· Enriched breads and pasta. °· Romaine lettuce. °TALK TO YOUR HEALTH CARE PROVIDER IF: °· You are in your first trimester and have high blood sugar. °· You are in your first trimester and develop a high fever. °In almost all cases, a fetus found to have an NTD will need specialized care that may not be available in all hospitals. Talk to your health care provider about what is best for you and your baby. °Document Released: 02/23/2003 Document Revised: 07/07/2013 Document Reviewed: 05/26/2009 °ExitCare® Patient Information ©2015 [employer]. This information is not intended to replace advice given to you by your health care provider. Make sure  you discuss any questions you have with your health care provider. ° °

## 2014-09-06 NOTE — ED Notes (Signed)
MD at bedside. 

## 2014-09-06 NOTE — ED Notes (Signed)
Pt. currently at ultrasound.  

## 2014-09-06 NOTE — ED Notes (Signed)
Juice offered.

## 2016-11-15 ENCOUNTER — Emergency Department (HOSPITAL_BASED_OUTPATIENT_CLINIC_OR_DEPARTMENT_OTHER)
Admission: EM | Admit: 2016-11-15 | Discharge: 2016-11-15 | Disposition: A | Discharge status: Home / Self Care | Payer: 59 | Attending: Emergency Medicine | Admitting: Emergency Medicine

## 2016-11-15 ENCOUNTER — Encounter (HOSPITAL_BASED_OUTPATIENT_CLINIC_OR_DEPARTMENT_OTHER): Payer: Self-pay

## 2016-11-15 ENCOUNTER — Emergency Department (HOSPITAL_BASED_OUTPATIENT_CLINIC_OR_DEPARTMENT_OTHER): Payer: 59

## 2016-11-15 DIAGNOSIS — R310 Gross hematuria: Secondary | ICD-10-CM | POA: Diagnosis not present

## 2016-11-15 DIAGNOSIS — R109 Unspecified abdominal pain: Secondary | ICD-10-CM | POA: Diagnosis present

## 2016-11-15 DIAGNOSIS — F17210 Nicotine dependence, cigarettes, uncomplicated: Secondary | ICD-10-CM | POA: Insufficient documentation

## 2016-11-15 DIAGNOSIS — N200 Calculus of kidney: Secondary | ICD-10-CM | POA: Diagnosis not present

## 2016-11-15 LAB — COMPREHENSIVE METABOLIC PANEL
ALBUMIN: 4.3 g/dL (ref 3.5–5.0)
ALT: 20 U/L (ref 14–54)
AST: 24 U/L (ref 15–41)
Alkaline Phosphatase: 57 U/L (ref 38–126)
Anion gap: 9 (ref 5–15)
BILIRUBIN TOTAL: 0.3 mg/dL (ref 0.3–1.2)
BUN: 8 mg/dL (ref 6–20)
CO2: 22 mmol/L (ref 22–32)
Calcium: 9.4 mg/dL (ref 8.9–10.3)
Chloride: 106 mmol/L (ref 101–111)
Creatinine, Ser: 0.78 mg/dL (ref 0.44–1.00)
GFR calc Af Amer: >60 mL/min (ref >60)
GFR calc non Af Amer: >60 mL/min (ref >60)
GLUCOSE: 130 mg/dL — AB (ref 65–99)
POTASSIUM: 3 mmol/L — AB (ref 3.5–5.1)
Sodium: 137 mmol/L (ref 135–145)
TOTAL PROTEIN: 7.3 g/dL (ref 6.5–8.1)

## 2016-11-15 LAB — URINALYSIS, ROUTINE W REFLEX MICROSCOPIC
Glucose, UA: 100 mg/dL — AB
KETONES UR: 15 mg/dL — AB
NITRITE: POSITIVE — AB
PROTEIN: 100 mg/dL — AB
Specific Gravity, Urine: 1.02 (ref 1.005–1.030)
pH: 8 (ref 5.0–8.0)

## 2016-11-15 LAB — URINALYSIS, MICROSCOPIC (REFLEX)

## 2016-11-15 LAB — CBC WITH DIFFERENTIAL/PLATELET
BASOS ABS: 0 10*3/uL (ref 0.0–0.1)
BASOS PCT: 0 %
Eosinophils Absolute: 0 10*3/uL (ref 0.0–0.7)
Eosinophils Relative: 0 %
HEMATOCRIT: 40.3 % (ref 36.0–46.0)
HEMOGLOBIN: 13.6 g/dL (ref 12.0–15.0)
Lymphocytes Relative: 17 %
Lymphs Abs: 1.8 10*3/uL (ref 0.7–4.0)
MCH: 29.8 pg (ref 26.0–34.0)
MCHC: 33.7 g/dL (ref 30.0–36.0)
MCV: 88.2 fL (ref 78.0–100.0)
MONO ABS: 0.7 10*3/uL (ref 0.1–1.0)
Monocytes Relative: 7 %
NEUTROS ABS: 7.9 10*3/uL — AB (ref 1.7–7.7)
NEUTROS PCT: 76 %
Platelets: 287 10*3/uL (ref 150–400)
RBC: 4.57 MIL/uL (ref 3.87–5.11)
RDW: 11.9 % (ref 11.5–15.5)
WBC: 10.4 10*3/uL (ref 4.0–10.5)

## 2016-11-15 LAB — PREGNANCY, URINE: PREG TEST UR: NEGATIVE

## 2016-11-15 LAB — LIPASE, BLOOD: Lipase: 33 U/L (ref 11–51)

## 2016-11-15 IMAGING — CT CT RENAL STONE PROTOCOL
2 of 4 series · 16 of 46 positions shown, 18 images · non-contrast
Comparison: [DATE] CT

CLINICAL DATA: Right flank pain and hematuria starting this
morning.

EXAM:
CT ABDOMEN AND PELVIS WITHOUT CONTRAST
TECHNIQUE: Multidetector CT imaging of the abdomen and pelvis was performed
following the standard protocol without IV contrast.

[Series 2: axial st · axial · 0.84mm/px · z∈[-468,-33]mm · 13 of 97 slices shown, 15 images]
[im 5/97  soft-tissue]
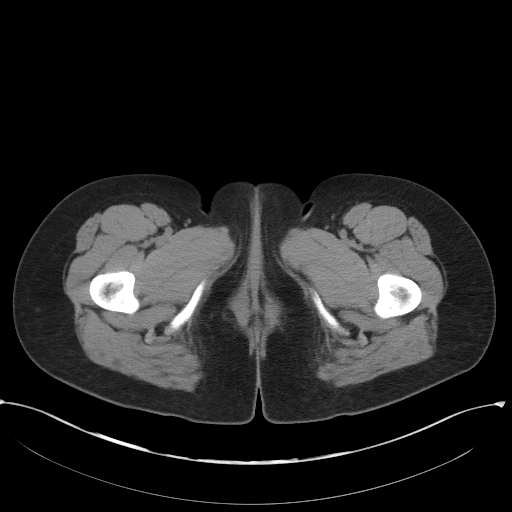
[im 5/97  bone]
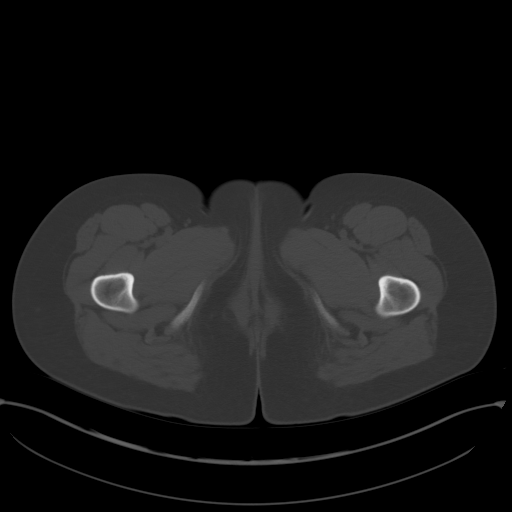
[im 13/97  soft-tissue]
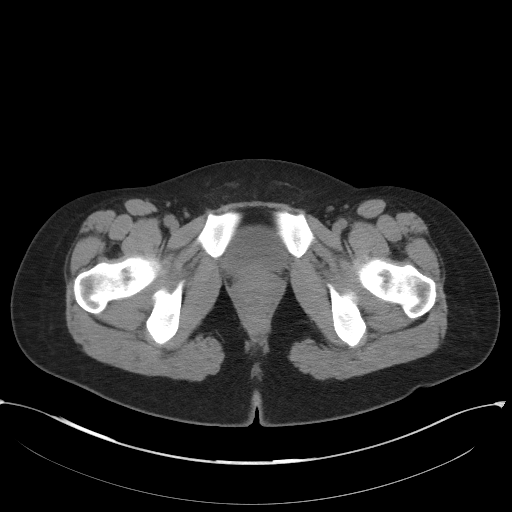
[im 21/97  soft-tissue]
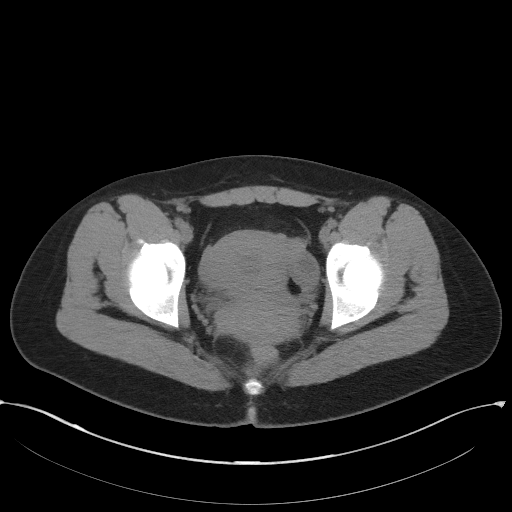
[im 26/97  soft-tissue]
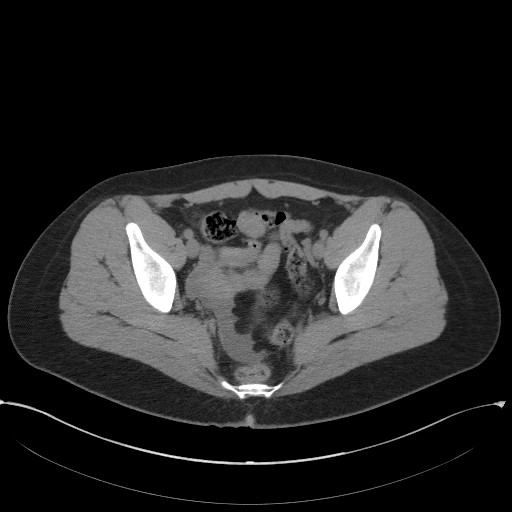
[im 34/97  soft-tissue]
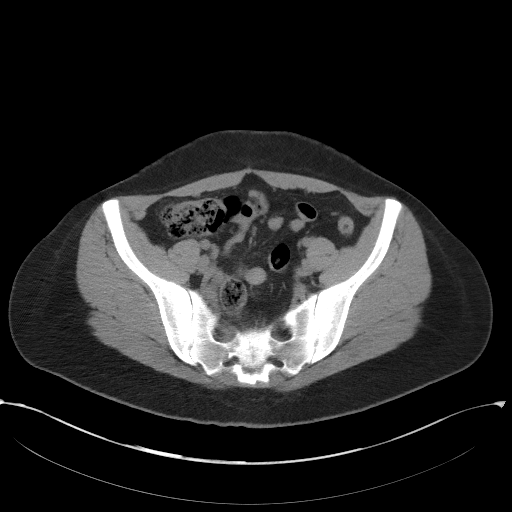
[im 42/97  soft-tissue]
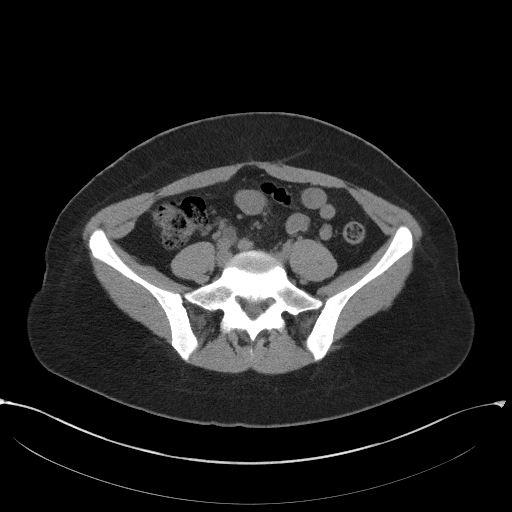
[im 51/97  soft-tissue]
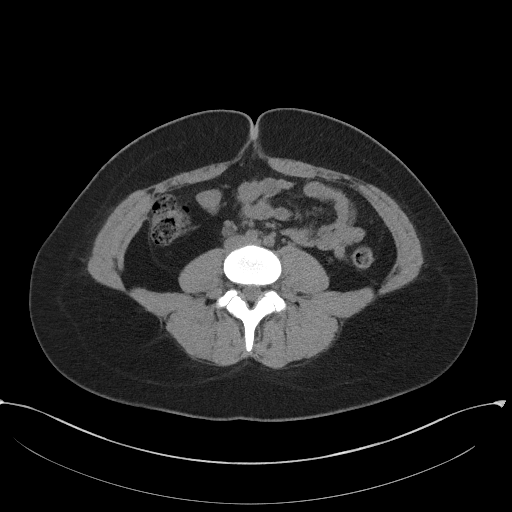
[im 55/97  soft-tissue]
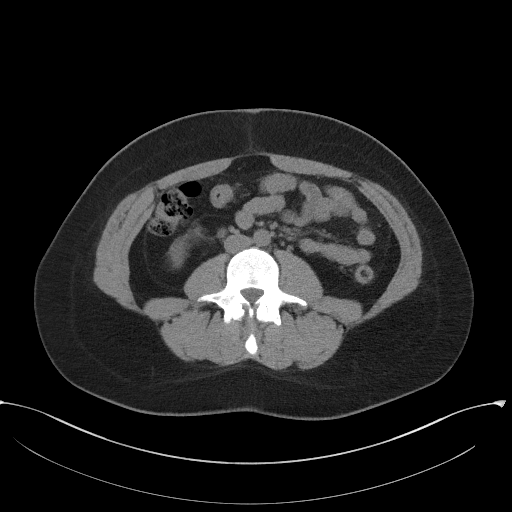
[im 63/97  soft-tissue]
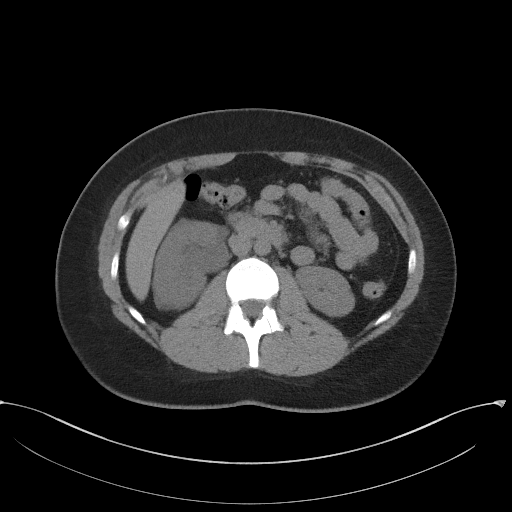
[im 63/97  bone]
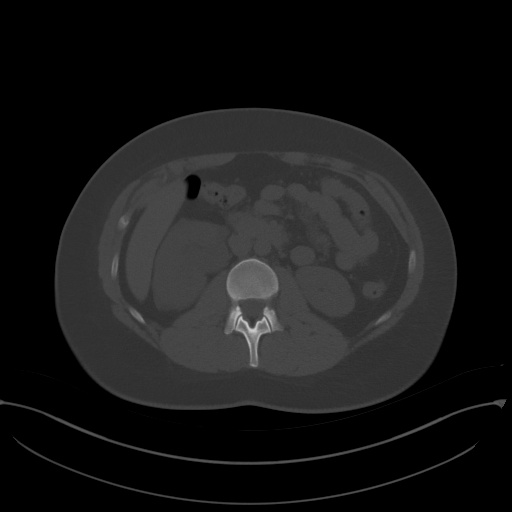
[im 71/97  soft-tissue]
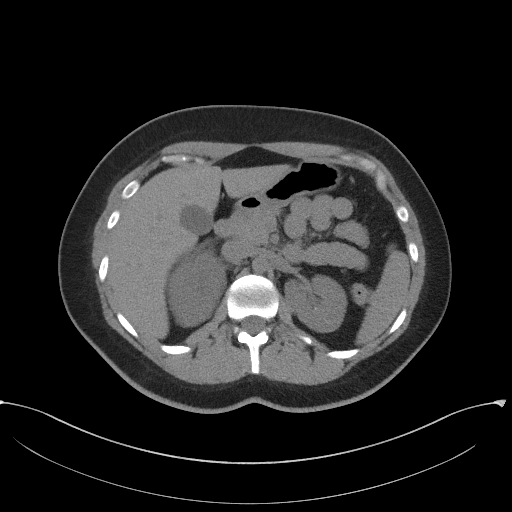
[im 76/97  soft-tissue]
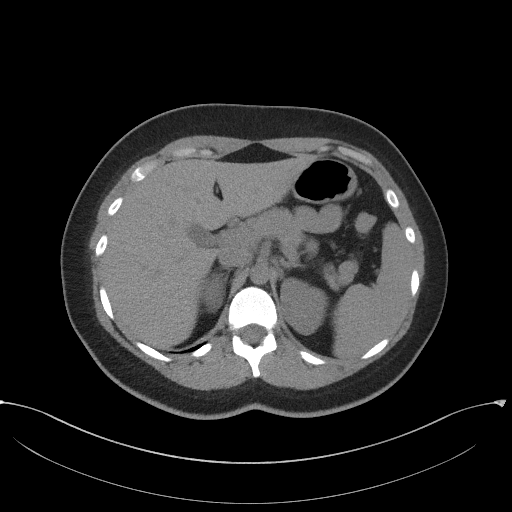
[im 84/97  soft-tissue]
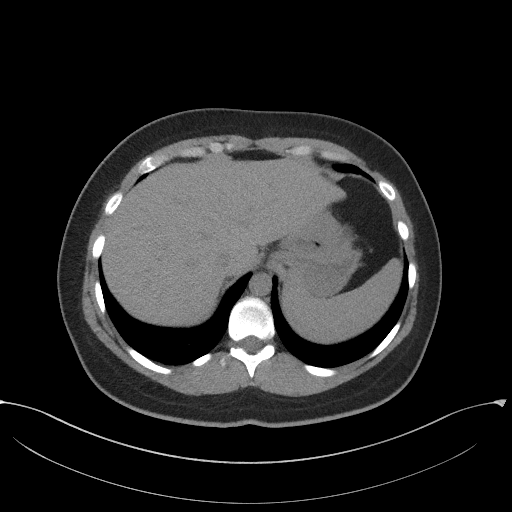
[im 92/97  soft-tissue]
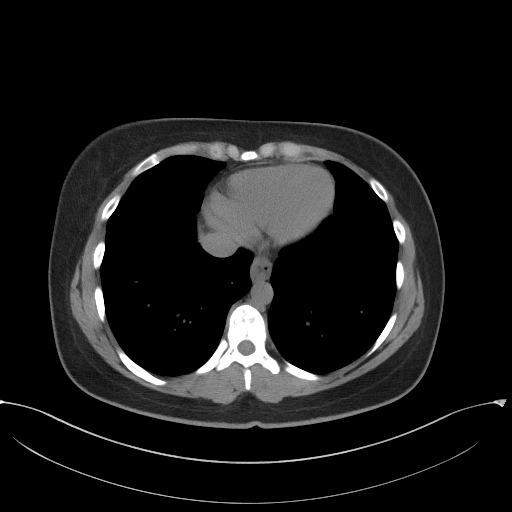

[Series 5: coronal st · coronal · 0.76mm/px · 3 of 101 slices shown]
[im 34/101  soft-tissue]
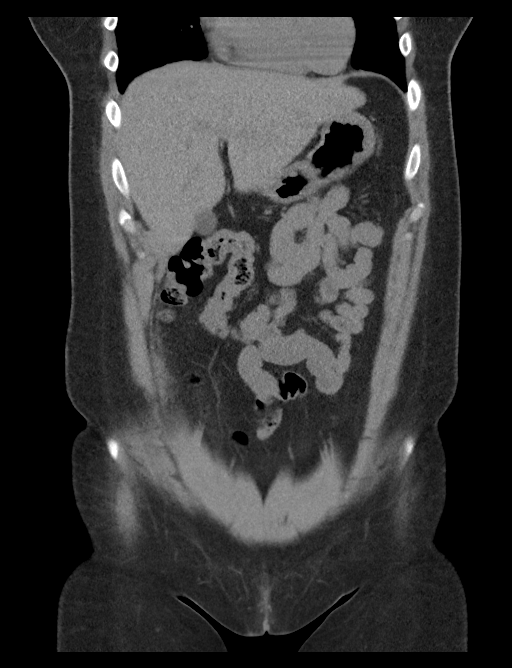
[im 45/101  soft-tissue]
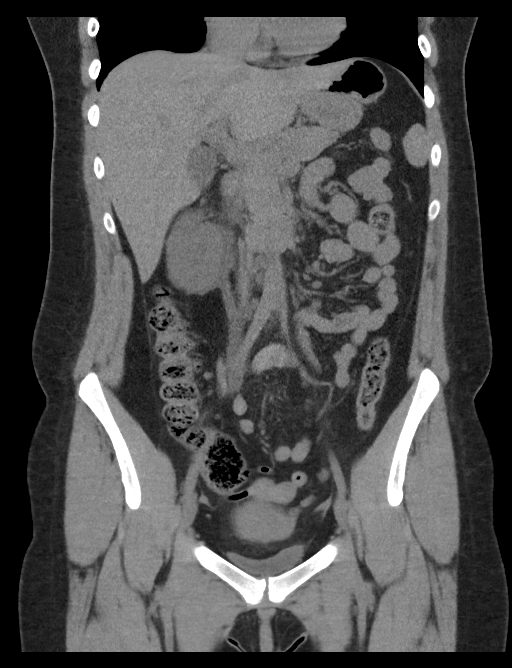
[im 56/101  soft-tissue]
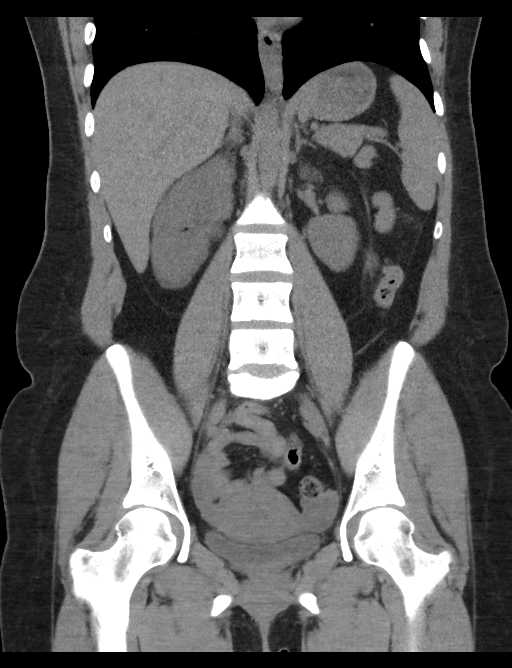

[16 of 46 positions shown; findings below may reference images not displayed]

FINDINGS: LOWER CHEST: Lung bases are clear. Included heart size is normal. No
pericardial effusion.

HEPATOBILIARY: Liver and gallbladder are normal.

PANCREAS: Normal.

SPLEEN: Normal.

ADRENALS/URINARY TRACT: Kidneys are orthotopic. There is perinephric
fat stranding with mild-to-moderate right-sided nephromegaly and
mild right-sided hydroureteronephrosis attributable to a 1 mm
calculus at the right ureterovesical junction, image 80/2. Punctate
calcifications are noted of the left kidney some which may represent
vascular calcifications. Urinary bladder is nondistended without
focal mural thickening.

STOMACH/BOWEL: The stomach, small and large bowel are normal in
course and caliber without inflammatory changes. Normal appendix.
There is a small hiatal hernia.

VASCULAR/LYMPHATIC: Aortoiliac vessels are normal in course and
caliber. No lymphadenopathy by CT size criteria.

REPRODUCTIVE: Uterus and adnexa are unremarkable. Physiologic sized
follicle in the left ovary.

OTHER: Small amount of physiologic fluid is seen in the pelvis.

MUSCULOSKELETAL: Nonacute.
IMPRESSION: 1 mm right UVJ stone causing mild right-sided hydroureteronephrosis,
right-sided nephromegaly and perinephric fat stranding.

## 2016-11-15 MED ORDER — TAMSULOSIN HCL 0.4 MG PO CAPS
0.4000 mg | ORAL_CAPSULE | Freq: Every day | ORAL | 0 refills | Status: DC
Start: 2016-11-15 — End: 2020-09-18

## 2016-11-15 MED ORDER — MORPHINE SULFATE (PF) 4 MG/ML IV SOLN
8.0000 mg | Freq: Once | INTRAVENOUS | Status: AC
  Administered 2016-11-15: 8 mg via INTRAVENOUS
  Filled 2016-11-15: qty 2

## 2016-11-15 MED ORDER — KETOROLAC TROMETHAMINE 30 MG/ML IJ SOLN
15.0000 mg | Freq: Once | INTRAMUSCULAR | Status: AC
  Administered 2016-11-15: 15 mg via INTRAVENOUS
  Filled 2016-11-15: qty 1

## 2016-11-15 MED ORDER — DEXTROSE 5 % IV SOLN
1.0000 g | Freq: Once | INTRAVENOUS | Status: AC
  Administered 2016-11-15: 1 g via INTRAVENOUS
  Filled 2016-11-15: qty 10

## 2016-11-15 MED ORDER — SODIUM CHLORIDE 0.9 % IV BOLUS (SEPSIS)
1000.0000 mL | Freq: Once | INTRAVENOUS | Status: AC
  Administered 2016-11-15: 1000 mL via INTRAVENOUS

## 2016-11-15 MED ORDER — MORPHINE SULFATE 15 MG PO TABS: 15.0000 mg | ORAL_TABLET | ORAL | 0 refills | Status: DC | PRN

## 2016-11-15 MED ORDER — CEFPODOXIME PROXETIL 200 MG PO TABS: 200.0000 mg | ORAL_TABLET | Freq: Two times a day (BID) | ORAL | 0 refills | Status: AC

## 2016-11-15 MED ORDER — MORPHINE SULFATE (PF) 4 MG/ML IV SOLN
4.0000 mg | Freq: Once | INTRAVENOUS | Status: AC
  Administered 2016-11-15: 4 mg via INTRAVENOUS
  Filled 2016-11-15: qty 1

## 2016-11-15 MED ORDER — ONDANSETRON 4 MG PO TBDP: ORAL_TABLET | ORAL | 0 refills | Status: DC

## 2016-11-15 MED ORDER — MORPHINE SULFATE (PF) 4 MG/ML IV SOLN
8.0000 mg | Freq: Once | INTRAVENOUS | Status: AC
Start: 2016-11-15 — End: 2016-11-15
  Administered 2016-11-15: 8 mg via INTRAVENOUS
  Filled 2016-11-15: qty 2

## 2016-11-15 MED ORDER — ONDANSETRON HCL 4 MG/2ML IJ SOLN
4.0000 mg | Freq: Once | INTRAMUSCULAR | Status: AC
  Administered 2016-11-15: 4 mg via INTRAVENOUS
  Filled 2016-11-15: qty 2

## 2016-11-15 MED FILL — CEFPODOXIME 200 MG TABLET: 200 | 7 days supply | Qty: 14 | Fill #0

## 2016-11-15 MED FILL — ONDANSETRON ODT 4 MG TABLET: 4 | 4 days supply | Qty: 20 | Fill #0

## 2016-11-15 MED FILL — TAMSULOSIN HCL 0.4 MG CAP: 0.4 | 30 days supply | Qty: 30 | Fill #0

## 2016-11-15 MED FILL — MORPHINE SULFATE IR 15 MG T: 15 | 5 days supply | Qty: 15 | Fill #0

## 2016-11-15 NOTE — Discharge Instructions (Signed)
Take 4 over the counter ibuprofen tablets 3 times a day or 2 over-the-counter naproxen tablets twice a day for pain. Also take tylenol (2 extra strength) four times a day.   Then take the pain medicine if you feel like you need it. Narcotics do not help with the pain, they only make you care about it less.  You can become addicted to this, people may break into your house to steal it.  It will constipate you.  If you drive under the influence of this medicine you can get a DUI.    Return for fever, worsening pain, inability to eat or drink

## 2016-11-15 NOTE — ED Provider Notes (Addendum)
[facility] Provider Note   CSN: [id number] Arrival date & time: 11/15/16  1250     History   Chief Complaint Chief Complaint  Patient presents with  . Flank Pain    HPI Brittany Harrison is a 20 y.o. female.  21 yo F with a cc of right flank pain.  Going on since this morning. Patient had right posterior back pain. Seems to come and go. Get much more severe throughout the day. Feels like a prior kidney stone that is much worse. Has had some nausea and vomiting with this. Now feels the pain is consistent. Denies fevers or chills denies dysuria or hesitancy or urgency. 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   The history is provided by the patient.  Flank Pain  This is a new problem. The current episode started 6 to 12 hours ago. The problem occurs constantly. The problem has not changed since onset.Associated symptoms include abdominal pain. Pertinent negatives include no chest pain, no headaches and no shortness of breath. Nothing aggravates the symptoms. Nothing relieves the symptoms. She has tried nothing for the symptoms. The treatment provided no relief.    Past Medical History:  Diagnosis Date  . Kidney stone   . Ovarian cyst   . Raynaud disease     There are no active problems to display for this patient.   History reviewed. No pertinent surgical history.  OB History    Gravida Para Term Preterm AB Living   1             SAB TAB Ectopic Multiple Live Births                   Home Medications    Prior to Admission medications   Medication Sig Start Date End Date Taking? Authorizing Provider  cefpodoxime (VANTIN) 200 MG tablet Take 1 tablet (200 mg total) by mouth 2 (two) times daily. 11/15/16 11/22/16  Melene Plan, DO  morphine (MSIR) 15 MG tablet Take 1 tablet (15 mg total) by mouth every 4 (four) hours as needed for severe pain. 11/15/16   Melene Plan, DO  ondansetron (ZOFRAN ODT) 4 MG disintegrating tablet  ODT q4  hours prn nausea/vomit 11/15/16   Melene Plan, DO  tamsulosin (FLOMAX) 0.4 MG CAPS capsule Take 1 capsule (0.4 mg total) by mouth daily after supper. 11/15/16   Melene Plan, DO    Family History No family history on file.  Social History Social History  Substance Use Topics  . Smoking status: Current Every Day Smoker    Packs/day: 0.50    Types: Cigarettes  . Smokeless tobacco: Never Used  . Alcohol use No     Allergies   Codeine and Sulfa antibiotics   Review of Systems Review of Systems  Constitutional: Negative for chills and fever.  HENT: Negative for congestion and rhinorrhea.   Eyes: Negative for redness and visual disturbance.  Respiratory: Negative for shortness of breath and wheezing.   Cardiovascular: Negative for chest pain and palpitations.  Gastrointestinal: Positive for abdominal pain, nausea and vomiting. Negative for constipation and diarrhea.  Genitourinary: Positive for flank pain and hematuria. Negative for dysuria and urgency.  Musculoskeletal: Negative for arthralgias and myalgias.  Skin: Negative for pallor and wound.  Neurological: Negative for dizziness and headaches.     Physical Exam Updated Vital Signs BP 126/80   Pulse 78   Temp 98 F (36.7 C) (Oral)   Resp Marland Kitchen)  24   Ht  (1.753 m)   Wt 83 kg (183 lb)   LMP 11/01/2016   SpO2 100%   Breastfeeding? Unknown   BMI 27.02 kg/m   Physical Exam  Constitutional: She is oriented to person, place, and time. She appears well-developed and well-nourished. No distress.  HENT:  Head: Normocephalic and atraumatic.  Eyes: Pupils are equal, round, and reactive to light. EOM are normal.  Neck: Normal range of motion. Neck supple.  Cardiovascular: Normal rate and regular rhythm.  Exam reveals no gallop and no friction rub.   No murmur heard. Pulmonary/Chest: Effort normal. She has no wheezes. She has no rales.  Abdominal: Soft. She exhibits no distension and no mass. There is tenderness (diffuse,  worst on the right side). There is no guarding.  Musculoskeletal: She exhibits no edema or tenderness.  Neurological: She is alert and oriented to person, place, and time.  Skin: Skin is warm and dry. She is not diaphoretic.  Psychiatric: She has a normal mood and affect. Her behavior is normal.  Nursing note and vitals reviewed.    ED Treatments / Results  Labs (all labs ordered are listed, but only abnormal results are displayed) Labs Reviewed  URINALYSIS, ROUTINE W REFLEX MICROSCOPIC - Abnormal; Notable for the following:       Result Value   Color, Urine ORANGE (*)    APPearance CLOUDY (*)    Glucose, UA 100 (*)    Hgb urine dipstick TRACE (*)    Bilirubin Urine SMALL (*)    Ketones, ur 15 (*)    Protein, ur 100 (*)    Nitrite POSITIVE (*)    Leukocytes, UA TRACE (*)    All other components within normal limits  CBC WITH DIFFERENTIAL/PLATELET - Abnormal; Notable for the following:    Neutro Abs 7.9 (*)    All other components within normal limits  COMPREHENSIVE METABOLIC PANEL - Abnormal; Notable for the following:    Potassium 3.0 (*)    Glucose, Bld 130 (*)    All other components within normal limits  URINALYSIS, MICROSCOPIC (REFLEX) - Abnormal; Notable for the following:    Bacteria, UA MANY (*)    Squamous Epithelial / LPF 0-5 (*)    All other components within normal limits  PREGNANCY, URINE  LIPASE, BLOOD    EKG  EKG Interpretation None       Radiology Ct Renal Stone Study  Result Date: 11/15/2016 CLINICAL DATA:  Right flank pain and hematuria starting this morning. EXAM: CT ABDOMEN AND PELVIS WITHOUT CONTRAST TECHNIQUE: Multidetector CT imaging of the abdomen and pelvis was performed following the standard protocol without IV contrast. COMPARISON:  05/14/2014 CT FINDINGS: LOWER CHEST: Lung bases are clear. Included heart size is normal. No pericardial effusion. HEPATOBILIARY: Liver and gallbladder are normal. PANCREAS: Normal. SPLEEN: Normal.  ADRENALS/URINARY TRACT: Kidneys are orthotopic. There is perinephric fat stranding with mild-to-moderate right-sided nephromegaly and mild right-sided hydroureteronephrosis attributable to a 1 mm calculus at the right ureterovesical junction, image 80/2. Punctate calcifications are noted of the left kidney some which may represent vascular calcifications. Urinary bladder is nondistended without focal mural thickening. STOMACH/BOWEL: The stomach, small and large bowel are normal in course and caliber without inflammatory changes. Normal appendix. There is a small hiatal hernia. VASCULAR/LYMPHATIC: Aortoiliac vessels are normal in course and caliber. No lymphadenopathy by CT size criteria. REPRODUCTIVE: Uterus and adnexa are unremarkable. Physiologic sized follicle in the left ovary. OTHER: Small amount of physiologic fluid is seen  in the pelvis. MUSCULOSKELETAL: Nonacute. IMPRESSION: 1 mm right UVJ stone causing mild right-sided hydroureteronephrosis, right-sided nephromegaly and perinephric fat stranding. Electronically Signed   By: Tollie Ethavid  Kwon M.D.   On: 11/15/2016 14:38    Procedures Procedures (including critical care time)  Medications Ordered in ED Medications  cefTRIAXone (ROCEPHIN) 1 g in dextrose 5 % 50 mL IVPB (1 g Intravenous New Bag/Given 11/15/16 1518)  morphine 4 MG/ML injection 4 mg (4 mg Intravenous Given 11/15/16 1320)  ondansetron (ZOFRAN) injection 4 mg (4 mg Intravenous Given 11/15/16 1320)  sodium chloride 0.9 % bolus 1,000 mL (0 mLs Intravenous Stopped 11/15/16 1431)  morphine 4 MG/ML injection 8 mg (8 mg Intravenous Given 11/15/16 1343)  morphine 4 MG/ML injection 8 mg (8 mg Intravenous Given 11/15/16 1433)  ketorolac (TORADOL) 30 MG/ML injection 15 mg (15 mg Intravenous Given 11/15/16 1430)     Initial Impression / Assessment and Plan / ED Course  I have reviewed the triage vital signs and the nursing notes.  Pertinent labs & imaging results that were available during my care of  the patient were reviewed by me and considered in my medical decision making (see chart for details).     21 yo F with a cc of R flank pain.  Feels like her prior kidney stone though much worse in character.  Likely stone by hx though fairly severe abdominal pain on exam.  Will obtain CT.   CT with 1mm stone.  Discussed with urology, Dr. Alvester MorinBell, felt unlikely to need emergent procedure due to size of stone, no noted leukocytosis, no fever.    Given rocephin.   Patient still feeling well, will trial outpatient, strict return precautions.   3:45 PM:  I have discussed the diagnosis/risks/treatment options with the patient and family and believe the pt to be eligible for discharge home to follow-up with Urology. We also discussed returning to the ED immediately if new or worsening sx occur. We discussed the sx which are most concerning (e.g., sudden worsening pain, fever, inability to tolerate by mouth) that necessitate immediate return. Medications administered to the patient during their visit and any new prescriptions provided to the patient are listed below.  Medications given during this visit Medications  cefTRIAXone (ROCEPHIN) 1 g in dextrose 5 % 50 mL IVPB (1 g Intravenous New Bag/Given 11/15/16 1518)  morphine 4 MG/ML injection 4 mg (4 mg Intravenous Given 11/15/16 1320)  ondansetron (ZOFRAN) injection 4 mg (4 mg Intravenous Given 11/15/16 1320)  sodium chloride 0.9 % bolus 1,000 mL (0 mLs Intravenous Stopped 11/15/16 1431)  morphine 4 MG/ML injection 8 mg (8 mg Intravenous Given 11/15/16 1343)  morphine 4 MG/ML injection 8 mg (8 mg Intravenous Given 11/15/16 1433)  ketorolac (TORADOL) 30 MG/ML injection 15 mg (15 mg Intravenous Given 11/15/16 1430)     The patient appears reasonably screen and/or stabilized for discharge and I doubt any other medical condition or other [facility] requiring further screening, evaluation, or treatment in the ED at this time prior to discharge.      Final Clinical  Impressions(s) / ED Diagnoses   Final diagnoses:  Nephrolithiasis  Gross hematuria    New Prescriptions New Prescriptions   CEFPODOXIME (VANTIN) 200 MG TABLET    Take 1 tablet (200 mg total) by mouth 2 (two) times daily.   MORPHINE (MSIR) 15 MG TABLET    Take 1 tablet (15 mg total) by mouth every 4 (four) hours as needed for severe pain.   ONDANSETRON [facility](ZOFRAN  ODT) 4 MG DISINTEGRATING TABLET     ODT q4 hours prn nausea/vomit   TAMSULOSIN (FLOMAX) 0.4 MG CAPS CAPSULE    Take 1 capsule (0.4 mg total) by mouth daily after supper.     Melene Plan, DO 11/15/16 1546    Melene Plan, DO 11/15/16 1547

## 2016-11-15 NOTE — ED Notes (Signed)
ED Provider at bedside. 

## 2016-11-15 NOTE — ED Triage Notes (Signed)
C/o right flank pain since this am-presents to triage in w/c-crying

## 2016-11-16 LAB — URINE CULTURE: Culture: NO GROWTH

## 2020-09-17 ENCOUNTER — Other Ambulatory Visit: Payer: Self-pay

## 2020-09-17 ENCOUNTER — Encounter (HOSPITAL_BASED_OUTPATIENT_CLINIC_OR_DEPARTMENT_OTHER): Payer: Self-pay | Admitting: Emergency Medicine

## 2020-09-17 ENCOUNTER — Emergency Department (HOSPITAL_BASED_OUTPATIENT_CLINIC_OR_DEPARTMENT_OTHER): Payer: Medicaid Other

## 2020-09-17 ENCOUNTER — Inpatient Hospital Stay (HOSPITAL_BASED_OUTPATIENT_CLINIC_OR_DEPARTMENT_OTHER)
Admission: EM | Admit: 2020-09-17 | Discharge: 2020-09-18 | DRG: 103 | Disposition: A | Discharge status: Home / Self Care | Payer: Medicaid Other | Source: Ambulatory Visit | Attending: Neurology | Admitting: Neurology

## 2020-09-17 DIAGNOSIS — F419 Anxiety disorder, unspecified: Secondary | ICD-10-CM | POA: Diagnosis present

## 2020-09-17 DIAGNOSIS — Z87442 Personal history of urinary calculi: Secondary | ICD-10-CM

## 2020-09-17 DIAGNOSIS — F32A Depression, unspecified: Secondary | ICD-10-CM | POA: Diagnosis present

## 2020-09-17 DIAGNOSIS — G43909 Migraine, unspecified, not intractable, without status migrainosus: Secondary | ICD-10-CM | POA: Diagnosis present

## 2020-09-17 DIAGNOSIS — I6389 Other cerebral infarction: Secondary | ICD-10-CM | POA: Diagnosis not present

## 2020-09-17 DIAGNOSIS — Z20822 Contact with and (suspected) exposure to covid-19: Secondary | ICD-10-CM | POA: Diagnosis present

## 2020-09-17 DIAGNOSIS — Z8673 Personal history of transient ischemic attack (TIA), and cerebral infarction without residual deficits: Secondary | ICD-10-CM | POA: Diagnosis not present

## 2020-09-17 DIAGNOSIS — Z885 Allergy status to narcotic agent status: Secondary | ICD-10-CM | POA: Diagnosis not present

## 2020-09-17 DIAGNOSIS — F17210 Nicotine dependence, cigarettes, uncomplicated: Secondary | ICD-10-CM | POA: Diagnosis present

## 2020-09-17 DIAGNOSIS — I671 Cerebral aneurysm, nonruptured: Secondary | ICD-10-CM | POA: Diagnosis present

## 2020-09-17 DIAGNOSIS — G43709 Chronic migraine without aura, not intractable, without status migrainosus: Secondary | ICD-10-CM

## 2020-09-17 DIAGNOSIS — I10 Essential (primary) hypertension: Secondary | ICD-10-CM | POA: Diagnosis present

## 2020-09-17 DIAGNOSIS — Z882 Allergy status to sulfonamides status: Secondary | ICD-10-CM

## 2020-09-17 DIAGNOSIS — Z79899 Other long term (current) drug therapy: Secondary | ICD-10-CM | POA: Diagnosis not present

## 2020-09-17 DIAGNOSIS — G8194 Hemiplegia, unspecified affecting left nondominant side: Secondary | ICD-10-CM | POA: Diagnosis present

## 2020-09-17 DIAGNOSIS — I7300 Raynaud's syndrome without gangrene: Secondary | ICD-10-CM | POA: Diagnosis present

## 2020-09-17 DIAGNOSIS — Z9282 Status post administration of tPA (rtPA) in a different facility within the last 24 hours prior to admission to current facility: Secondary | ICD-10-CM

## 2020-09-17 DIAGNOSIS — I639 Cerebral infarction, unspecified: Secondary | ICD-10-CM | POA: Diagnosis not present

## 2020-09-17 DIAGNOSIS — G43109 Migraine with aura, not intractable, without status migrainosus: Secondary | ICD-10-CM | POA: Diagnosis present

## 2020-09-17 DIAGNOSIS — R200 Anesthesia of skin: Secondary | ICD-10-CM | POA: Diagnosis present

## 2020-09-17 DIAGNOSIS — E785 Hyperlipidemia, unspecified: Secondary | ICD-10-CM | POA: Diagnosis present

## 2020-09-17 DIAGNOSIS — R2990 Unspecified symptoms and signs involving the nervous system: Secondary | ICD-10-CM

## 2020-09-17 LAB — COMPREHENSIVE METABOLIC PANEL
ALT: 29 U/L (ref 0–44)
AST: 25 U/L (ref 15–41)
Albumin: 4.2 g/dL (ref 3.5–5.0)
Alkaline Phosphatase: 52 U/L (ref 38–126)
Anion gap: 6 (ref 5–15)
BUN: 7 mg/dL (ref 6–20)
CO2: 26 mmol/L (ref 22–32)
Calcium: 9.4 mg/dL (ref 8.9–10.3)
Chloride: 107 mmol/L (ref 98–111)
Creatinine, Ser: 0.65 mg/dL (ref 0.44–1.00)
GFR, Estimated: >60 mL/min (ref >60)
Glucose, Bld: 107 mg/dL — ABNORMAL HIGH (ref 70–99)
Potassium: 3.5 mmol/L (ref 3.5–5.1)
Sodium: 139 mmol/L (ref 135–145)
Total Bilirubin: 0.3 mg/dL (ref 0.3–1.2)
Total Protein: 7.2 g/dL (ref 6.5–8.1)

## 2020-09-17 LAB — DIFFERENTIAL
Abs Immature Granulocytes: 0.02 10*3/uL (ref 0.00–0.07)
Basophils Absolute: 0 10*3/uL (ref 0.0–0.1)
Basophils Relative: 0 %
Eosinophils Absolute: 0.2 10*3/uL (ref 0.0–0.5)
Eosinophils Relative: 2 %
Immature Granulocytes: 0 %
Lymphocytes Relative: 32 %
Lymphs Abs: 2.4 10*3/uL (ref 0.7–4.0)
Monocytes Absolute: 0.5 10*3/uL (ref 0.1–1.0)
Monocytes Relative: 7 %
Neutro Abs: 4.4 10*3/uL (ref 1.7–7.7)
Neutrophils Relative %: 59 %

## 2020-09-17 LAB — CBC
HCT: 41.8 % (ref 36.0–46.0)
Hemoglobin: 14.5 g/dL (ref 12.0–15.0)
MCH: 30.8 pg (ref 26.0–34.0)
MCHC: 34.7 g/dL (ref 30.0–36.0)
MCV: 88.7 fL (ref 80.0–100.0)
Platelets: 248 10*3/uL (ref 150–400)
RBC: 4.71 MIL/uL (ref 3.87–5.11)
RDW: 11.7 % (ref 11.5–15.5)
WBC: 7.5 10*3/uL (ref 4.0–10.5)
nRBC: 0 % (ref 0.0–0.2)

## 2020-09-17 LAB — ETHANOL: Alcohol, Ethyl (B): <10 mg/dL (ref <10)

## 2020-09-17 LAB — CBG MONITORING, ED: Glucose-Capillary: 105 mg/dL — ABNORMAL HIGH (ref 70–99)

## 2020-09-17 LAB — RESP PANEL BY RT-PCR (FLU A&B, COVID) ARPGX2
Influenza A by PCR: NEGATIVE
Influenza B by PCR: NEGATIVE
SARS Coronavirus 2 by RT PCR: NEGATIVE

## 2020-09-17 LAB — PROTIME-INR
INR: 1 (ref 0.8–1.2)
Prothrombin Time: 12.9 seconds (ref 11.4–15.2)

## 2020-09-17 LAB — APTT: aPTT: 31 seconds (ref 24–36)

## 2020-09-17 IMAGING — CT CT HEAD CODE STROKE
3 series · 14 of 47 positions shown, 16 images · non-contrast
Comparison: None.
COMPARISON: None.

Addendum:
CLINICAL DATA: Code stroke. 25-year-old female with left eye
blurred vision, left lip numbness.

EXAM:
CT HEAD WITHOUT CONTRAST
TECHNIQUE: Contiguous axial images were obtained from the base of the skull
through the vertex without intravenous contrast.

[Series 3: head wo · axial · 0.44mm/px · z∈[-132,+8]mm · 8 of 34 slices shown, 10 images]
[im 3/34  brain]
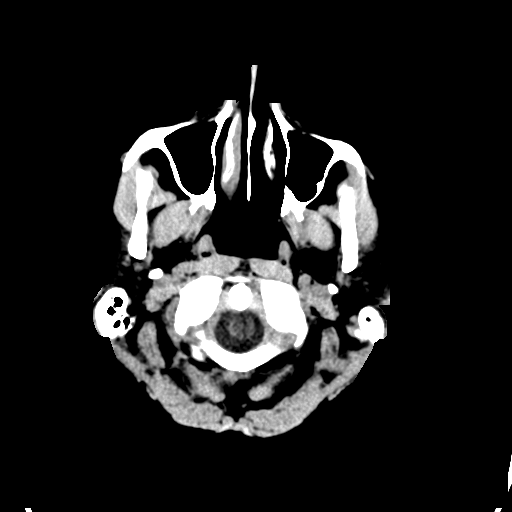
[im 3/34  bone]
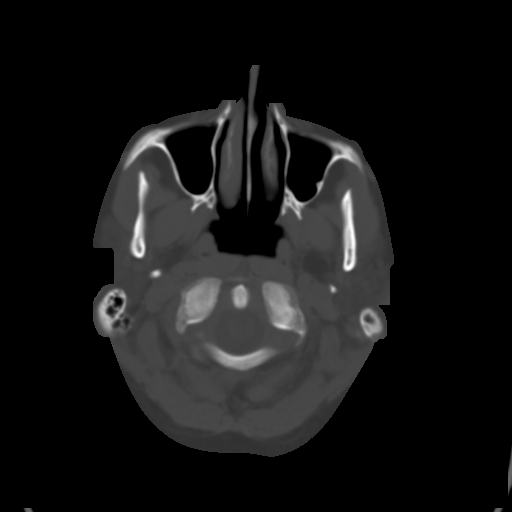
[im 7/34  brain]
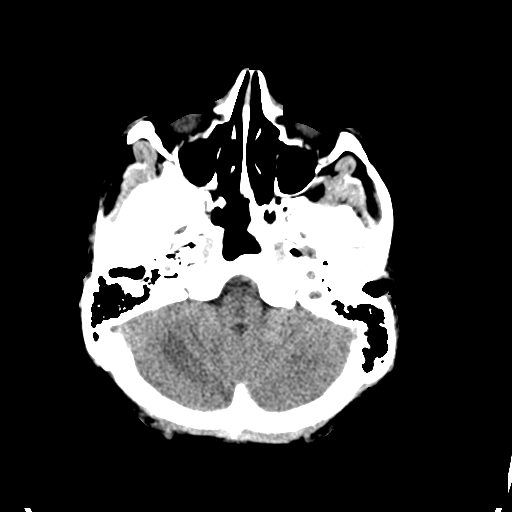
[im 11/34  brain]
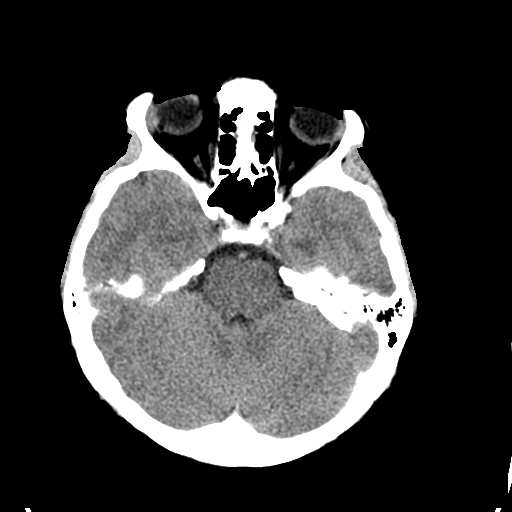
[im 15/34  brain]
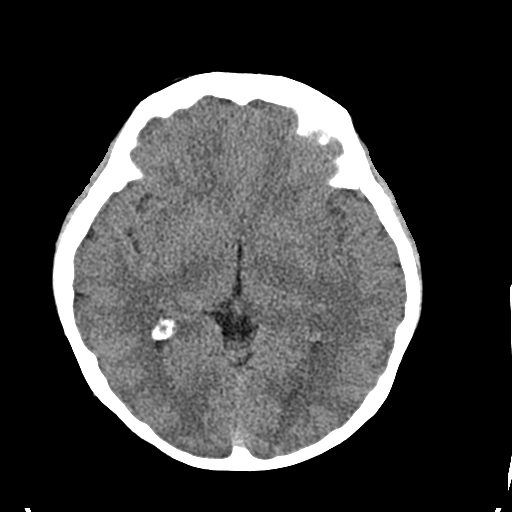
[im 19/34  brain]
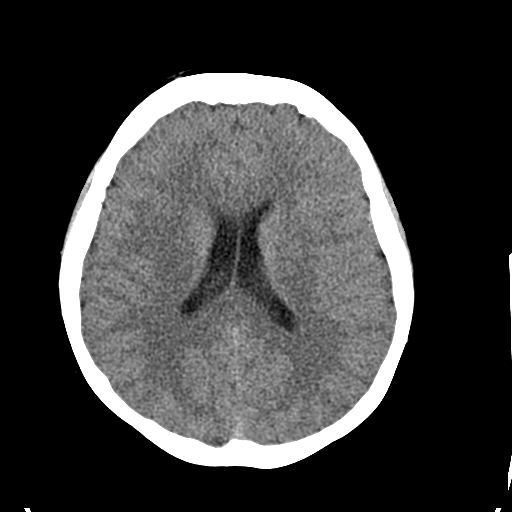
[im 19/34  bone]
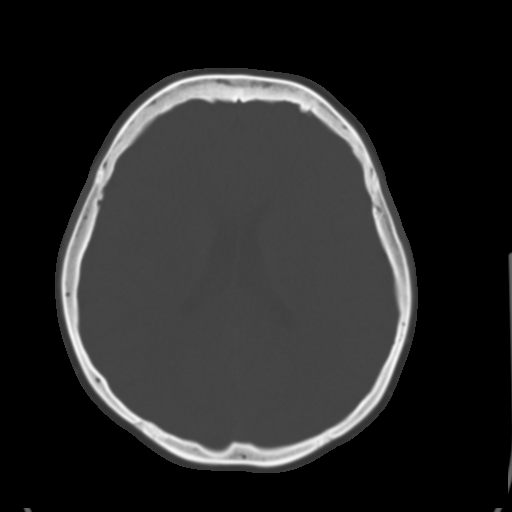
[im 23/34  brain]
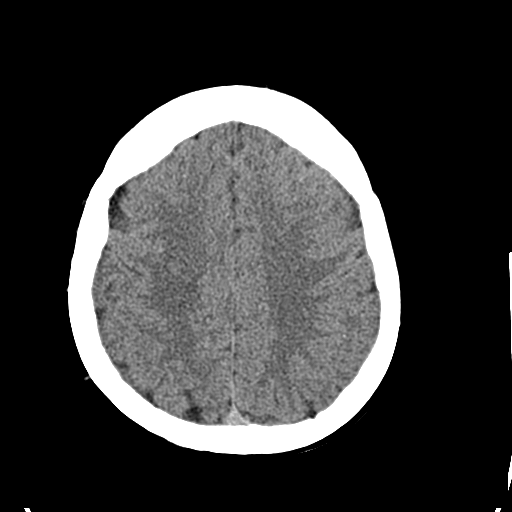
[im 27/34  brain]
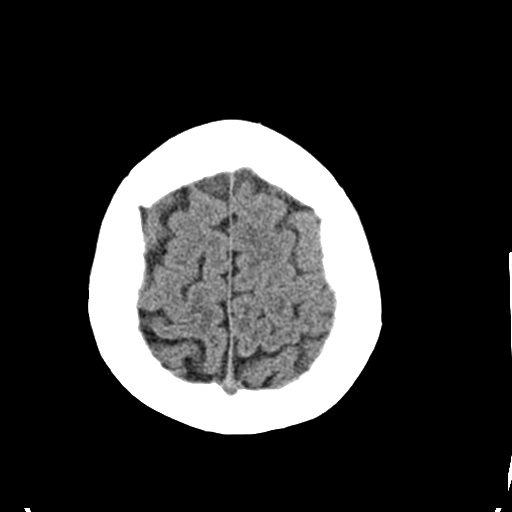
[im 31/34  brain]
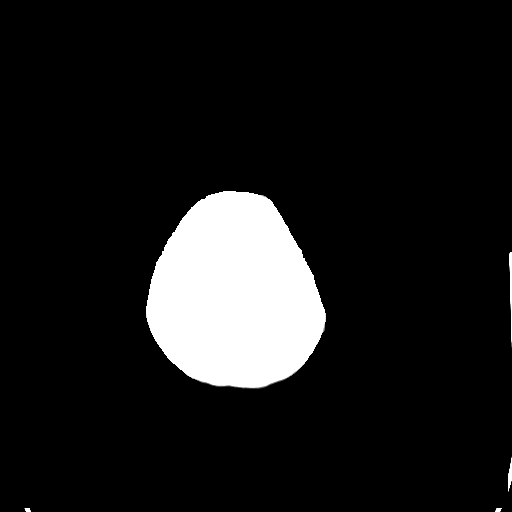

[Series 5: cor soft · coronal · 0.33mm/px · 3 of 72 slices shown]
[im 24/72  brain]
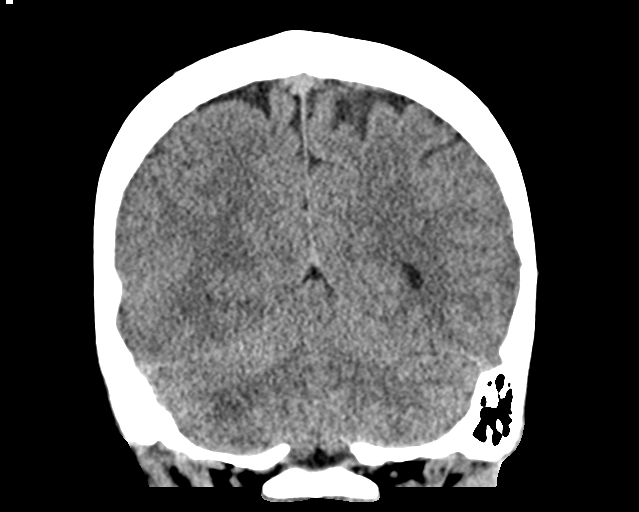
[im 32/72  brain]
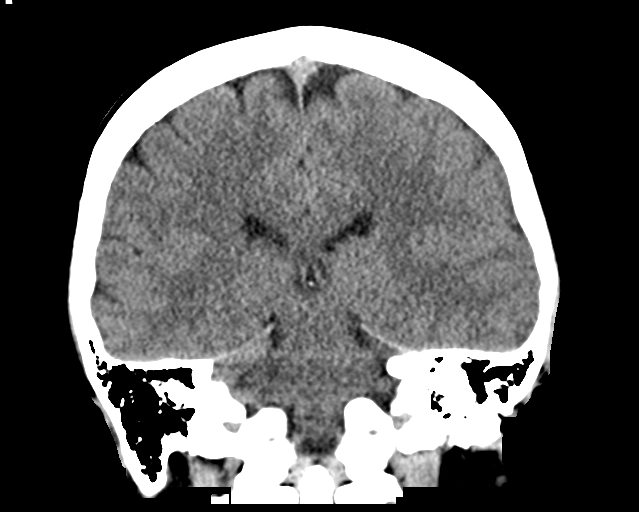
[im 40/72  brain]
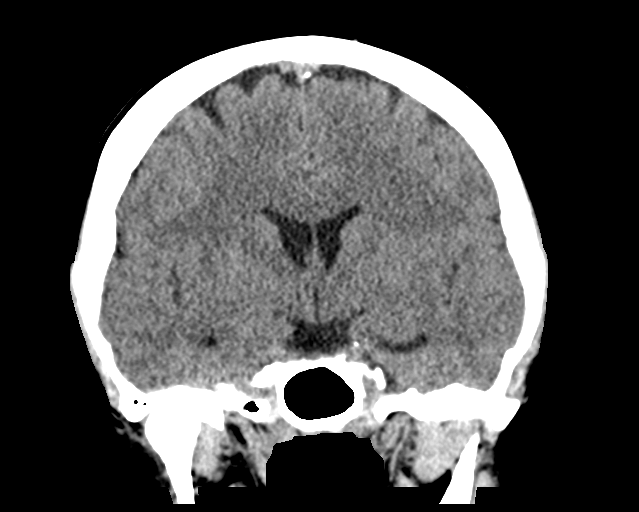

[Series 6: sag soft · sagittal · 0.34mm/px · 3 of 65 slices shown]
[im 22/65  brain]
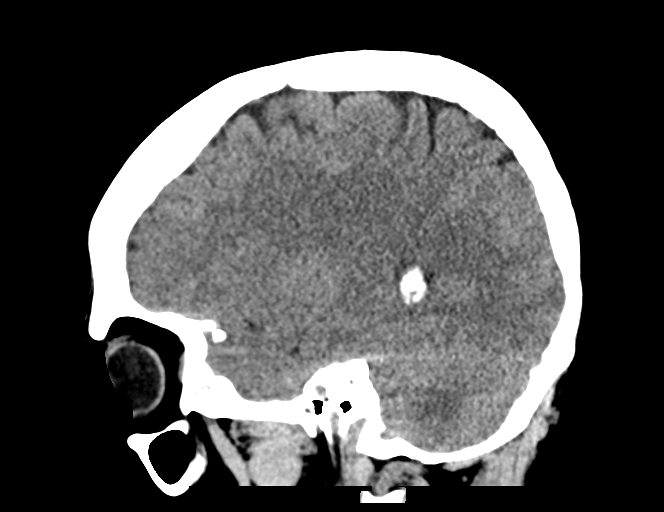
[im 33/65  brain]
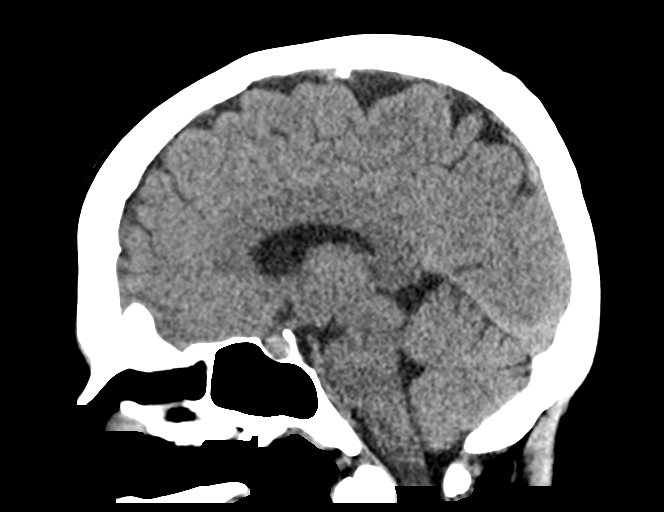
[im 43/65  brain]
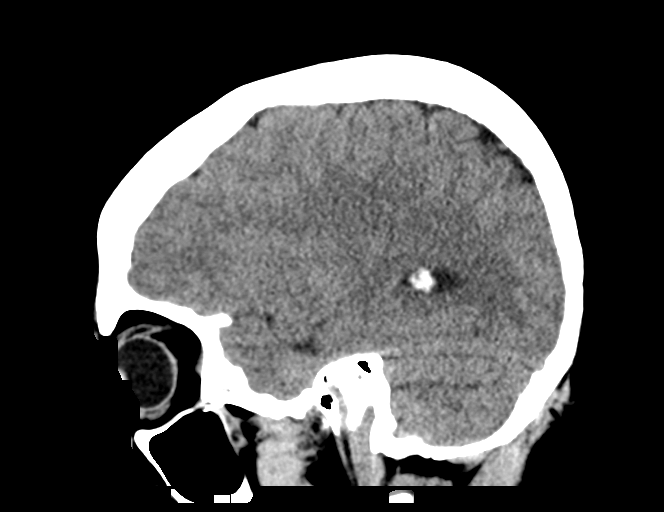

[14 of 47 positions shown; findings below may reference images not displayed]

FINDINGS: Brain: Normal cerebral volume. No midline shift, ventriculomegaly,
mass effect, evidence of mass lesion, intracranial hemorrhage or
evidence of cortically based acute infarction. Gray-white matter
differentiation is within normal limits throughout the brain.

Vascular: No suspicious intracranial vascular hyperdensity.

Skull: Negative.

Sinuses/Orbits: Visualized paranasal sinuses and mastoids are clear.

Other: Visualized orbit soft tissues are within normal limits.
Visualized scalp soft tissues are within normal limits.

ASPECTS ([place] Stroke Program Early CT Score)

Total score (0-10 with 10 being normal): 10
IMPRESSION: Normal noncontrast Head CT.  ASPECTS 10.

ADDENDUM:
Study discussed by telephone with Dr. LACKSON on [DATE] at [0J]
hours.

*** End of Addendum ***
FINDINGS: Brain: Normal cerebral volume. No midline shift, ventriculomegaly,
mass effect, evidence of mass lesion, intracranial hemorrhage or
evidence of cortically based acute infarction. Gray-white matter
differentiation is within normal limits throughout the brain.

Vascular: No suspicious intracranial vascular hyperdensity.

Skull: Negative.

Sinuses/Orbits: Visualized paranasal sinuses and mastoids are clear.

Other: Visualized orbit soft tissues are within normal limits.
Visualized scalp soft tissues are within normal limits.

ASPECTS ([place] Stroke Program Early CT Score)

Total score (0-10 with 10 being normal): 10
IMPRESSION: Normal noncontrast Head CT.  ASPECTS 10.

## 2020-09-17 MED ORDER — ACETAMINOPHEN 650 MG RE SUPP: 650.0000 mg | RECTAL | Status: DC | PRN

## 2020-09-17 MED ORDER — ACETAMINOPHEN 500 MG PO TABS
1000.0000 mg | ORAL_TABLET | Freq: Once | ORAL | Status: AC
  Administered 2020-09-17: 1000 mg via ORAL
  Filled 2020-09-17: qty 2

## 2020-09-17 MED ORDER — ENOXAPARIN SODIUM 40 MG/0.4ML IJ SOSY: 40.0000 mg | PREFILLED_SYRINGE | INTRAMUSCULAR | Status: DC

## 2020-09-17 MED ORDER — PROCHLORPERAZINE EDISYLATE 10 MG/2ML IJ SOLN: 10.0000 mg | Freq: Four times a day (QID) | INTRAMUSCULAR | Status: DC | PRN

## 2020-09-17 MED ORDER — ACETAMINOPHEN 160 MG/5ML PO SOLN: 650.0000 mg | ORAL | Status: DC | PRN

## 2020-09-17 MED ORDER — ALTEPLASE (STROKE) FULL DOSE INFUSION
0.9000 mg/kg | Freq: Once | INTRAVENOUS | Status: AC
  Filled 2020-09-17: qty 100

## 2020-09-17 MED ORDER — ACETAMINOPHEN 325 MG PO TABS
650.0000 mg | ORAL_TABLET | ORAL | Status: DC | PRN
  Administered 2020-09-17: 650 mg via ORAL
  Filled 2020-09-17: qty 2

## 2020-09-17 MED ORDER — ACETAMINOPHEN 325 MG PO TABS: 650.0000 mg | ORAL_TABLET | ORAL | Status: DC | PRN

## 2020-09-17 MED ORDER — SODIUM CHLORIDE 0.9 % IV SOLN
50.0000 mL | Freq: Once | INTRAVENOUS | Status: AC
  Administered 2020-09-17: 50 mL via INTRAVENOUS

## 2020-09-17 MED ORDER — STROKE: EARLY STAGES OF RECOVERY BOOK: Freq: Once | Status: DC

## 2020-09-17 MED ORDER — SENNOSIDES-DOCUSATE SODIUM 8.6-50 MG PO TABS: 1.0000 | ORAL_TABLET | Freq: Every evening | ORAL | Status: DC | PRN

## 2020-09-17 MED ORDER — STROKE: EARLY STAGES OF RECOVERY BOOK
Freq: Once | Status: AC
  Filled 2020-09-17: qty 1

## 2020-09-17 MED ORDER — PANTOPRAZOLE SODIUM 40 MG IV SOLR
40.0000 mg | Freq: Every day | INTRAVENOUS | Status: DC
  Administered 2020-09-18: 40 mg via INTRAVENOUS
  Filled 2020-09-17: qty 40

## 2020-09-17 MED ORDER — ALTEPLASE 100 MG IV SOLR
INTRAVENOUS | Status: AC
  Administered 2020-09-17: 78.6 mg via INTRAVENOUS
  Filled 2020-09-17: qty 100

## 2020-09-17 NOTE — Plan of Care (Signed)

## 2020-09-17 NOTE — ED Provider Notes (Addendum)
[facility] Provider Note: Lowella Dell, MD, FACEP  CSN: [id number] MRN: [id number] ARRIVAL: 09/17/20 at 0257 ROOM: [facility]06/[facility]06   CHIEF COMPLAINT  Facial Swelling and Headache   HISTORY OF PRESENT ILLNESS  09/17/20 3:37 AM Brittany Harrison is a 25 y.o. female who has had a headache for 3 days.  The headache is not severe and is typical of the headache she frequently gets.  She describes it as an aching pain about the left and currently rates it as a 2 out of 10.  She is here because at 1 AM she noticed the left side of her lower lip was swollen and the left side of her face felt numb but not completely insensate.  She is not aware of any unilateral numbness or weakness of the body.  Nursing staff alerted me of the patient's neurologic symptoms as well as stroke screening exam that showed left-sided weakness and altered sensation of the body.   Past Medical History:  Diagnosis Date   Kidney stone    Ovarian cyst    Raynaud disease     History reviewed. No pertinent surgical history.  No family history on file.  Social History   Tobacco Use   Smoking status: Every Day    Packs/day: 0.50    Types: Cigarettes   Smokeless tobacco: Never  Substance Use Topics   Alcohol use: No   Drug use: No    Prior to Admission medications   Medication Sig Start Date End Date Taking? Authorizing Provider  morphine (MSIR) 15 MG tablet Take 1 tablet (15 mg total) by mouth every 4 (four) hours as needed for severe pain. 11/15/16   Melene Plan, DO  ondansetron (ZOFRAN ODT) 4 MG disintegrating tablet 4mg  ODT q4 hours prn nausea/vomit 11/15/16   01/15/17, DO  tamsulosin (FLOMAX) 0.4 MG CAPS capsule Take 1 capsule (0.4 mg total) by mouth daily after supper. 11/15/16   01/15/17, DO    Allergies Codeine and Sulfa antibiotics   REVIEW OF SYSTEMS  Negative except as noted here or in the History of Present Illness.   PHYSICAL EXAMINATION  Initial Vital Signs Blood pressure (!)  127/96, pulse 98, temperature 98.5 F (36.9 C), temperature source Oral, resp. rate 18, height 5\' 9"  (1.753 m), weight 82.6 kg, last menstrual period 09/15/2020, SpO2 97 %, unknown if currently breastfeeding.  Examination General: Well-developed, well-nourished female in no acute distress; appearance consistent with age of record HENT: normocephalic; swelling of the left lower lip Eyes: pupils equal, round and reactive to light; extraocular muscles intact Neck: supple Heart: regular rate and rhythm Lungs: clear to auscultation bilaterally Abdomen: soft; nondistended; nontender; bowel sounds present Extremities: No deformity; full range of motion; pulses normal Neurologic: Awake, alert and oriented; no facial droop; tongue midline; decreased sensation of left side of face and altered sensation of left side of body; plus 4 out of 5 strength on the left (left arm and leg can be raised symmetrically with the right but then drift down) Skin: Warm and dry Psychiatric: Flat affect   RESULTS  Summary of this visit's results, reviewed and interpreted by myself:   EKG Interpretation  Date/Time:  Friday September 17 2020 03:52:34 EDT Ventricular Rate:  92 PR Interval:  171 QRS Duration: 100 QT Interval:  348 QTC Calculation: 431 R Axis:   58 Text Interpretation: Sinus rhythm Normal ECG No old tracing to compare Confirmed by 09/17/2020 (12-20-1993) on 09/17/2020 5:12:36 AM  Laboratory Studies: Results for orders placed or performed during the hospital encounter of 09/17/20 (from the past 24 hour(s))  CBG monitoring, ED     Status: Abnormal   Collection Time: 09/17/20  3:40 AM  Result Value Ref Range   Glucose-Capillary 105 (H) 70 - 99 mg/dL  Resp Panel by RT-PCR (Flu A&B, Covid) Nasopharyngeal Swab     Status: None   Collection Time: 09/17/20  3:42 AM   Specimen: Nasopharyngeal Swab; Nasopharyngeal(NP) swabs in vial transport medium  Result Value Ref Range   SARS Coronavirus 2 by RT PCR  NEGATIVE NEGATIVE   Influenza A by PCR NEGATIVE NEGATIVE   Influenza B by PCR NEGATIVE NEGATIVE  Ethanol     Status: None   Collection Time: 09/17/20  3:42 AM  Result Value Ref Range   Alcohol, Ethyl (B) <10 <10 mg/dL  Protime-INR     Status: None   Collection Time: 09/17/20  3:42 AM  Result Value Ref Range   Prothrombin Time 12.9 11.4 - 15.2 seconds   INR 1.0 0.8 - 1.2  APTT     Status: None   Collection Time: 09/17/20  3:42 AM  Result Value Ref Range   aPTT 31 24 - 36 seconds  CBC     Status: None   Collection Time: 09/17/20  3:42 AM  Result Value Ref Range   WBC 7.5 4.0 - 10.5 K/uL   RBC 4.71 3.87 - 5.11 MIL/uL   Hemoglobin 14.5 12.0 - 15.0 g/dL   HCT 73.7 10.6 - 26.9 %   MCV 88.7 80.0 - 100.0 fL   MCH 30.8 26.0 - 34.0 pg   MCHC 34.7 30.0 - 36.0 g/dL   RDW 48.5 46.2 - 70.3 %   Platelets 248 150 - 400 K/uL   nRBC 0.0 0.0 - 0.2 %  Differential     Status: None   Collection Time: 09/17/20  3:42 AM  Result Value Ref Range   Neutrophils Relative % 59 %   Neutro Abs 4.4 1.7 - 7.7 K/uL   Lymphocytes Relative 32 %   Lymphs Abs 2.4 0.7 - 4.0 K/uL   Monocytes Relative 7 %   Monocytes Absolute 0.5 0.1 - 1.0 K/uL   Eosinophils Relative 2 %   Eosinophils Absolute 0.2 0.0 - 0.5 K/uL   Basophils Relative 0 %   Basophils Absolute 0.0 0.0 - 0.1 K/uL   Immature Granulocytes 0 %   Abs Immature Granulocytes 0.02 0.00 - 0.07 K/uL  Comprehensive metabolic panel     Status: Abnormal   Collection Time: 09/17/20  3:42 AM  Result Value Ref Range   Sodium 139 135 - 145 mmol/L   Potassium 3.5 3.5 - 5.1 mmol/L   Chloride 107 98 - 111 mmol/L   CO2 26 22 - 32 mmol/L   Glucose, Bld 107 (H) 70 - 99 mg/dL   BUN 7 6 - 20 mg/dL   Creatinine, Ser 5.00 0.44 - 1.00 mg/dL   Calcium 9.4 8.9 - 93.8 mg/dL   Total Protein 7.2 6.5 - 8.1 g/dL   Albumin 4.2 3.5 - 5.0 g/dL   AST 25 15 - 41 U/L   ALT 29 0 - 44 U/L   Alkaline Phosphatase 52 38 - 126 U/L   Total Bilirubin 0.3 0.3 - 1.2 mg/dL   GFR,  Estimated >18 >29 mL/min   Anion gap 6 5 - 15   Imaging Studies: CT HEAD CODE STROKE WO CONTRAST  Addendum Date: 09/17/2020  ADDENDUM REPORT: 09/17/2020 04:17 ADDENDUM: Study discussed by telephone with Dr. Read Drivers on 09/17/2020 at 0400 hours. Electronically Signed   By: Odessa Fleming M.D.   On: 09/17/2020 04:17   Result Date: 09/17/2020 CLINICAL DATA:  Code stroke. 25 year old female with left eye blurred vision, left lip numbness. EXAM: CT HEAD WITHOUT CONTRAST TECHNIQUE: Contiguous axial images were obtained from the base of the skull through the vertex without intravenous contrast. COMPARISON:  None. FINDINGS: Brain: Normal cerebral volume. No midline shift, ventriculomegaly, mass effect, evidence of mass lesion, intracranial hemorrhage or evidence of cortically based acute infarction. Gray-white matter differentiation is within normal limits throughout the brain. Vascular: No suspicious intracranial vascular hyperdensity. Skull: Negative. Sinuses/Orbits: Visualized paranasal sinuses and mastoids are clear. Other: Visualized orbit soft tissues are within normal limits. Visualized scalp soft tissues are within normal limits. ASPECTS [facility] Stroke Program Early CT Score) Total score (0-10 with 10 being normal): 10 IMPRESSION: Normal noncontrast Head CT.  ASPECTS 10. Electronically Signed: By: Odessa Fleming M.D. On: 09/17/2020 03:59    ED COURSE and MDM  Nursing notes, initial and subsequent vitals signs, including pulse oximetry, reviewed and interpreted by myself.  Vitals:   09/17/20 0530 09/17/20 0545 09/17/20 0600 09/17/20 0617  BP: (!) 121/93 (!) 126/95 (!) 124/96 102/85  Pulse: 85 84 86 84  Resp: 19 19 16  (!) 21  Temp:      TempSrc:      SpO2: 94% 100% 100% 100%  Weight:      Height:       Medications  alteplase (ACTIVASE) 1 mg/mL infusion SOLN 78.6 mg (0 mg/kg  87.3 kg Intravenous Stopped 09/17/20 0524)    Followed by  0.9 %  sodium chloride infusion (0 mLs Intravenous Stopped 09/17/20 0628)     3:40 AM Code stroke called.  This could represent a complex migraine but patient has never had these neurologic symptoms with a headache in the past.  4:33 AM tPA administered on order of teleneurologist.  She agrees this could be complex migraine but the patient had an NIH score of 3 per her findings and she decided to proceed with tPA.  6:27 AM Discussed with Dr. 09/19/20 who accepts the patient for admission to the [facility] stroke service.  Patient's neurologic exam is unchanged.  She has had no worsening of her headache.  She is still able to ambulate without difficulty.  PROCEDURES  Procedures CRITICAL CARE Performed by: [facility] Marieliz Strang Total critical care time: 30 minutes Critical care time was exclusive of separately billable procedures and treating other patients. Critical care was necessary to treat or prevent imminent or life-threatening deterioration. Critical care was time spent personally by me on the following activities: development of treatment plan with patient and/or surrogate as well as nursing, discussions with consultants, evaluation of patient's response to treatment, examination of patient, obtaining history from patient or surrogate, ordering and performing treatments and interventions, ordering and review of laboratory studies, ordering and review of radiographic studies, pulse oximetry and re-evaluation of patient's condition.   ED DIAGNOSES     ICD-10-CM   1. Cerebrovascular accident (CVA), unspecified mechanism (HCC)  I63.9          Shelia Kingsberry, MD 09/17/20 0631    09/19/20, MD 09/17/20 [phone]

## 2020-09-17 NOTE — ED Notes (Signed)
Report given to Hazel with [facility].  

## 2020-09-17 NOTE — ED Notes (Signed)
Patient transferred from [facility] after TPA , patient is c/o headache of which she has had for 3 days. States she is still having the numb sensation to the left side of her body , still has some blurred vision however it is better.

## 2020-09-17 NOTE — ED Notes (Signed)
MRI called. Pt is on the line to get MRI.

## 2020-09-17 NOTE — ED Notes (Signed)
Pt is asking for pain meds for headache. MD notified.

## 2020-09-17 NOTE — ED Notes (Signed)
Attempted to call report, advised they will call back in 5 min.

## 2020-09-17 NOTE — Consult Note (Signed)
[facility] Tele[facility]urology Consult Services   Date of Service:   09/17/2020 03:41:51  Diagnosis:       R51.9 - Headache, unspecified       R20.2 - Paresthesia of skin  Impression:      left arm drift, left leg drift, decreased sensation in the left face/arm/leg, and blurred vision in the left eye only in the setting of a migraine. Unclear if this is right hemispheric stroke versus migraine. The vision field deficit in the right upper quadrant is only in the left eye and not present in the right eye, increasing suspicion for complicated migraine. However, since she has never had the one sided numbness/weakness with her migraines in the past, will need to rule out right hemispheric stroke.  Metrics: Last Known Well: 09/17/2020 01:00:00 [facility] Notification Time: 09/17/2020 03:41:51 Arrival Time: 09/17/2020 02:57:00 Stamp Time: 09/17/2020 03:41:51 Initial Response Time: 09/17/2020 03:45:33 Symptoms: left sided numbness/weakness. NIHSS Start Assessment Time: 09/17/2020 03:54:40 Patient is a candidate for Thrombolytic. Thrombolytic Medical Decision: 09/17/2020 04:04:10 Needle Time: 09/17/2020 04:24:04 Weight Noted by Staff: 87.3 kg  CT head showed no acute hemorrhage or acute core infarct.  ED Physician notified of diagnostic impression and management plan on 09/17/2020 04:28:07  Advanced Imaging: Advanced Imaging Not Recommended because: NIHSS < 6 with no cortical findings on exam. History and exam are not consistent with LVO.   Thrombolytic Contraindications:  Last Known Well > 4.5 hours: No CT Head showing hemorrhage: No Ischemic stroke within 3 months: No Severe head trauma within 3 months: No Intracranial/intraspinal surgery within 3 months: No History of intracranial hemorrhage: No Symptoms and signs consistent with an SAH: No GI malignancy or GI bleed within 21 days: No Coagulopathy: Platelets <100 000 /mm3, INR >1.7, aPTT>40 s, or PT >15 s:  No Treatment dose of LMWH within the previous 24 hrs: No Use of NOACs in past 48 hours: No Glycoprotein IIb/IIIa receptor inhibitors use: No Symptoms consistent with infective endocarditis: No Suspected aortic arch dissection: No Intra-axial intracranial neoplasm: No  Thrombolytic Decision and Management Plan: Management with thrombolytic treatment was explained to the Patient as was risks and benefits and alternatives to the treatment. Patient agrees with the decision to proceed with thrombolytic treatment. . All questions were answered and the Patient expressed understanding of the treatment plan.  Our recommendations are outlined below.  Recommendations: IV Alteplase recommended.  Thrombolytic bolus given Without Complication.   IV Alteplase/Activase Total Dose - 78.6 mg IV Alteplase/Activase Bolus Dose - 7.9 mg IV Alteplase/Activase Infusion Dose - 70.7 mg   Routine post Thrombolytic monitoring including neuro checks and blood pressure control during/after treatment Monitor blood pressure Check blood pressure and neuro assessment every 15 min for 2 h, then every 30 min for 6 h, and finally every hour for 16 h.  Manage Blood Pressure per post Thrombolytic protocol.        Admission to ICU       CT brain 24 hours post Thrombolytic       NPO until swallowing screen performed and passed       No antiplatelet agents or anticoagulants (including heparin for DVT prophylaxis) in first 24 hours       No Foley catheter, nasogastric tube, arterial catheter or central venous catheter for 24 hr, unless absolutely necessary       Telemetry       Bedside swallow evaluation       HOB less than 30 degrees       Euglycemia  Avoid hyperthermia, PRN acetaminophen       DVT prophylaxis       Inpatient Neurology Consultation       Stroke evaluation as per inpatient neurology recommendations  Discussed with ED  physician    ------------------------------------------------------------------------------  History of Present Illness: Patient is a 25 year old Female.  Patient was brought by private transportation with symptoms of left sided numbness/weakness.  25 year old RHD woman with history of migraine who presents with left sided weakness/numbness. She states her symptoms began at 0100 while in bed. She was not sleeping and felt the symptoms come on. She described feeling as if her left face was drooping and tingling. She slo feels numbness and weakness in the left arm and left leg. She has blurred vision in the left eye only. She currently has a migraine but has never had symptoms like this with her migraines in the past.    Anticoagulant use:  No  Antiplatelet use: No  Allergies:  Reviewed    Examination: BP(127/96), Pulse(98), Blood Glucose(105) 1A: Level of Consciousness - Alert; keenly responsive + 0 1B: Ask Month and Age - Both Questions Right + 0 1C: Blink Eyes & Squeeze Hands - Performs Both Tasks + 0 2: Test Horizontal Extraocular Movements - Normal + 0 3: Test Visual Fields - No Visual Loss + 0 4: Test Facial Palsy (Use Grimace if Obtunded) - Normal symmetry + 0 5A: Test Left Arm Motor Drift - Drift, but doesn't hit bed + 1 5B: Test Right Arm Motor Drift - No Drift for 10 Seconds + 0 6A: Test Left Leg Motor Drift - Drift, but doesn't hit bed + 1 6B: Test Right Leg Motor Drift - No Drift for 5 Seconds + 0 7: Test Limb Ataxia (FNF/Heel-Shin) - No Ataxia + 0 8: Test Sensation - Mild-Moderate Loss: Less Sharp/More Dull + 1 9: Test Language/Aphasia - Normal; No aphasia + 0 10: Test Dysarthria - Normal + 0 11: Test Extinction/Inattention - No abnormality + 0  NIHSS Score: 3  Pre-Morbid Modified Rankin Scale: 0 Points = No symptoms at all   Patient/Family was informed the Neurology Consult would occur via TeleHealth consult by way of interactive audio and video  telecommunications and consented to receiving care in this manner.   Patient is being evaluated for possible acute neurologic impairment and high probability of imminent or life-threatening deterioration. I spent total of 40 minutes providing care to this patient, including time for face to face visit via telemedicine, review of medical records, imaging studies and discussion of findings with providers, the patient and/or family.   Dr Vena Austria   [facility] [phone]  Case [id number]

## 2020-09-17 NOTE — H&P (Signed)
Neurology H&P-status post tPA at a freestanding ER  CC: Left-sided numbness and weakness  History is obtained from: Patient, chart  HPI: Brittany Harrison is a 25 y.o. female past medical history of anxiety and possibly depression untreated, chronic headaches, presented to the freestanding ER for complaints of sudden onset of left-sided weakness and numbness.  Her last level was noted to be 0100 hrs. on 09/17/2020.  She was seen by telemedicine neurology and given IV tPA due to left-sided symptoms. On my history taking in the emergency room at [facility] today, she reports that she was been having a headache for 3 days but it was at around 1 AM when she was laying in bed and started to feel numb around her left side of the face on the forehead, cheeks and the lips.  She also started feeling some numbness on the left arm and leg.  She has not had her headaches with any strokelike symptoms in the past.  This history was similar to what was provided to the telemedicine neurologist and decision to use tPA was made on the telemedicine evaluation. Extreme recent personal and professional stressors is what she reports on review of systems. Continues to have some headache.  Has been given Tylenol with some relief.  LKW: 0100  tpa given?: yes - by telemedicine specialists Premorbid modified Rankin scale (mRS): 0  ROS: Full ROS was performed and is negative except as noted in the HPI.  Past Medical History:  Diagnosis Date   Kidney stone    Ovarian cyst    Raynaud disease    No family history on file.   Social History:   reports that she has been smoking cigarettes. She has been smoking an average of .5 packs per day. She has never used smokeless tobacco. She reports that she does not drink alcohol and does not use drugs.  Medications  Current Facility-Administered Medications:     stroke: mapping our early stages of recovery book, , Does not apply, Once, Heard, Courtney S, NP    acetaminophen (TYLENOL) tablet 650 mg, 650 mg, Oral, Q4H PRN **OR** acetaminophen (TYLENOL) 160 MG/5ML solution 650 mg, 650 mg, Per Tube, Q4H PRN **OR** acetaminophen (TYLENOL) suppository 650 mg, 650 mg, Rectal, Q4H PRN, Heard, Courtney S, NP   [START ON 09/18/2020] enoxaparin (LOVENOX) injection 40 mg, 40 mg, Subcutaneous, Q24H, Heard, Courtney S, NP   senna-docusate (Senokot-S) tablet 1 tablet, 1 tablet, Oral, QHS PRN, Merry Lofty, NP  Current Outpatient Medications:    morphine (MSIR) 15 MG tablet, Take 1 tablet (15 mg total) by mouth every 4 (four) hours as needed for severe pain. (Patient not taking: No sig reported), Disp: 15 tablet, Rfl: 0   ondansetron (ZOFRAN ODT) 4 MG disintegrating tablet, 4mg  ODT q4 hours prn nausea/vomit (Patient not taking: No sig reported), Disp: 20 tablet, Rfl: 0   tamsulosin (FLOMAX) 0.4 MG CAPS capsule, Take 1 capsule (0.4 mg total) by mouth daily after supper. (Patient not taking: No sig reported), Disp: 30 capsule, Rfl: 0   Exam: Current vital signs: BP 104/89   Pulse 87   Temp 98.9 F (37.2 C) (Oral)   Resp 17   Ht 5\' 9"  (1.753 m)   Wt 87.3 kg   LMP 09/15/2020 (Approximate)   SpO2 98%   BMI 28.43 kg/m  Vital signs in last 24 hours: Temp:  [98.1 F (36.7 C)-98.9 F (37.2 C)] 98.9 F (37.2 C) (07/15 1010) Pulse Rate:  [72-107] 87 (07/15 1600)  Resp:  [9-22] 17 (07/15 1600) BP: (102-158)/(75-106) 104/89 (07/15 1600) SpO2:  [94 %-100 %] 98 % (07/15 1600) Weight:  [82.6 kg-87.3 kg] 87.3 kg (07/15 0400) General: Awake alert in no distress HEENT: Normocephalic/atraumatic Lungs: Clear Cardiovascular-regular rhythm Abdomen soft nondistended nontender Extremities warm well perfused Neurological exam Awake alert oriented x3 No dysarthria No aphasia Cranial nerves: Pupils equal round react light, extraocular movements appear intact, visual fields are intact but she reports some blurred vision to the left eye, facial sensation-splits the tuning  fork nearly about midline with diminished sensation to vibration on the left forehead in comparison to the right, light touch also diminished on the left face in comparison to the right, face appears symmetric, auditory acuity intact, tongue and palate midline. Motor examination with no vertical drift Sensation: Mild light touch sensation loss on the left hemibody with nearly sharp cut off in the midline. Coordination: No dysmetria Gait testing deferred NIH stroke scale 1a Level of Conscious.: 0 1b LOC Questions: 0 1c LOC Commands: 0 2 Best Gaze: 0 3 Visual: 0 4 Facial Palsy: 0 5a Motor Arm - left: 0 5b Motor Arm - Right: 0 6a Motor Leg - Left: 0 6b Motor Leg - Right: 0 7 Limb Ataxia: 0 8 Sensory: 1 9 Best Language: 0 10 Dysarthria: 0 11 Extinct. and Inatten.:  TOTAL: 1   Labs I have reviewed labs in epic and the results pertinent to this consultation are:   CBC    Component Value Date/Time   WBC 7.5 09/17/2020 0342   RBC 4.71 09/17/2020 0342   HGB 14.5 09/17/2020 0342   HCT 41.8 09/17/2020 0342   PLT 248 09/17/2020 0342   MCV 88.7 09/17/2020 0342   MCH 30.8 09/17/2020 0342   MCHC 34.7 09/17/2020 0342   RDW 11.7 09/17/2020 0342   LYMPHSABS 2.4 09/17/2020 0342   MONOABS 0.5 09/17/2020 0342   EOSABS 0.2 09/17/2020 0342   BASOSABS 0.0 09/17/2020 0342   CMP     Component Value Date/Time   NA 139 09/17/2020 0342   K 3.5 09/17/2020 0342   CL 107 09/17/2020 0342   CO2 26 09/17/2020 0342   GLUCOSE 107 (H) 09/17/2020 0342   BUN 7 09/17/2020 0342   CREATININE 0.65 09/17/2020 0342   CALCIUM 9.4 09/17/2020 0342   PROT 7.2 09/17/2020 0342   ALBUMIN 4.2 09/17/2020 0342   AST 25 09/17/2020 0342   ALT 29 09/17/2020 0342   ALKPHOS 52 09/17/2020 0342   BILITOT 0.3 09/17/2020 0342   GFRNONAA >60 09/17/2020 0342   GFRAA >60 11/15/2016 1314    Imaging I have reviewed the images obtained:  CT-scan of the brain-code stroke noncontrast head CT aspects 10.  No  bleed.   Assessment:  25/F s/p tPA for left-sided numbness and weakness in the setting of a headache-likely complex migraine but she was given IV tPA at the outside center where she was seen via telemedicine for concern for stroke. Other than complex migraine, conversion disorder should also be considered in the setting of recent stressors.  Admitted for post tPA care.  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 Continue Evaluation:  -Admit to: 3W progressive -Hold Aspirin until 24 hour post tPA neuroimaging is stable and without evidence of bleeding -Blood pressure control, goal of SYS <180 -MRI/MRA  head and neck/ECHO/A1C/Lipid panel. -Hyperglycemia management per SSI to maintain glucose 140-180mg /dL. -PT/OT/ST therapies and recommendations when able -Post tPA vitals  CNS Hemiplegia and hemiparesis following cerebral infarction affecting left non-dominant side  -PT/OT  Headache -No NSAIDs until 24-hour post tPA scan is negative for bleed -Can use Compazine IV or migraine cocktail without NSAIDs.  RESP Acute Respiratory Failure  -vent management per ICU -wean when able  CV No active issues  HEME Iron Deficiency Anemia Blood Loss Anemia Anemia in Chronic Diseases -Monitor -transfuse for hgb < 7  Coagulopathy secondary to tPA at outside ER  -monitor coags and for clinical signs of bleeding.  ENDO No active issues -goal HgbA1c < 7  GI/GU No active issues NS at 75/hr  Fluid/Electrolyte Disorders No active issues  ID No active issues Check CBC in the AM  Prophylaxis DVT:  SCDs GI: PPI Bowel: Docusate senna  Diet: NPO until cleared by speech/bedside swallow  Code Status: Full Code  THE FOLLOWING WERE PRESENT ON  ADMISSION: Hemiplegia/hemiparesis, coagulopathy due to IV tPA given at outside center  CRITICAL CARE ATTESTATION Performed by: Milon Dikes, MD Total critical care time: 40 minutes Critical care time was exclusive of separately billable procedures and treating other patients and/or supervising APPs/Residents/Students Critical care was necessary to treat or prevent imminent or life-threatening deterioration due to strokelike symptoms, IV thrombolysis at outside hospital. This patient is critically ill and at significant risk for neurological worsening and/or death and care requires constant monitoring. Critical care was time spent personally by me on the following activities: development of treatment plan with patient and/or surrogate as well as nursing, discussions with consultants, evaluation of patient's response to treatment, examination of patient, obtaining history from patient or surrogate, ordering and performing treatments and interventions, ordering and review of laboratory studies, ordering and review of radiographic studies, pulse oximetry, re-evaluation of patient's condition, participation in multidisciplinary rounds and medical decision making of high complexity in the care of this patient.

## 2020-09-17 NOTE — Progress Notes (Signed)
Received from ED on cart, accompanied by nurse.  Oriented to room and stroke education initiated.

## 2020-09-17 NOTE — ED Triage Notes (Signed)
Pt c/o left side of lip swelling and feeling numb. Pt states that the left side of her face is numb and is having blurry vision in left eye. Pt hx of migraine but none of these symptoms. Pt denies any extremity or body numbness. Pt left eye is dilated more than right side. Pt ambualtory with steady gait, in triage, VSS, GCS 15, NAD noted in triage.

## 2020-09-17 NOTE — ED Provider Notes (Signed)
25 year old female presents as an ER to ER transfer from [facility] after receiving tPA overnight as a code stroke.  She is transferred here awaiting neuro ICU bed placement.  Neurology stroke team has been notified of the patient's arrival.  Vitals are stable, patient continues to have headache of which she has had for the past 3 days but states that her neuro symptoms/numbness has improved since receiving the tPA.  She appears stable at this time, plan for admission to the stroke team.   Rozelle Logan, DO 09/17/20 1117

## 2020-09-17 NOTE — ED Notes (Signed)
Pt wheeled to CT via stretcher.  

## 2020-09-18 ENCOUNTER — Inpatient Hospital Stay (HOSPITAL_COMMUNITY): Payer: Medicaid Other

## 2020-09-18 DIAGNOSIS — G43109 Migraine with aura, not intractable, without status migrainosus: Secondary | ICD-10-CM | POA: Diagnosis not present

## 2020-09-18 DIAGNOSIS — I639 Cerebral infarction, unspecified: Secondary | ICD-10-CM | POA: Diagnosis not present

## 2020-09-18 DIAGNOSIS — I6389 Other cerebral infarction: Secondary | ICD-10-CM

## 2020-09-18 LAB — HIV ANTIBODY (ROUTINE TESTING W REFLEX): HIV Screen 4th Generation wRfx: NONREACTIVE

## 2020-09-18 LAB — CBC
HCT: 42.3 % (ref 36.0–46.0)
Hemoglobin: 14.6 g/dL (ref 12.0–15.0)
MCH: 30.6 pg (ref 26.0–34.0)
MCHC: 34.5 g/dL (ref 30.0–36.0)
MCV: 88.7 fL (ref 80.0–100.0)
Platelets: 238 10*3/uL (ref 150–400)
RBC: 4.77 MIL/uL (ref 3.87–5.11)
RDW: 11.8 % (ref 11.5–15.5)
WBC: 6.3 10*3/uL (ref 4.0–10.5)
nRBC: 0 % (ref 0.0–0.2)

## 2020-09-18 LAB — ECHOCARDIOGRAM COMPLETE
Area-P 1/2: 3.85 cm2
Height: 69 in
S' Lateral: 2.8 cm
Weight: 2966.51 oz

## 2020-09-18 LAB — COMPREHENSIVE METABOLIC PANEL
ALT: 42 U/L (ref 0–44)
AST: 28 U/L (ref 15–41)
Albumin: 3.8 g/dL (ref 3.5–5.0)
Alkaline Phosphatase: 52 U/L (ref 38–126)
Anion gap: 7 (ref 5–15)
BUN: 6 mg/dL (ref 6–20)
CO2: 23 mmol/L (ref 22–32)
Calcium: 9.6 mg/dL (ref 8.9–10.3)
Chloride: 109 mmol/L (ref 98–111)
Creatinine, Ser: 0.73 mg/dL (ref 0.44–1.00)
GFR, Estimated: >60 mL/min (ref >60)
Glucose, Bld: 111 mg/dL — ABNORMAL HIGH (ref 70–99)
Potassium: 3.7 mmol/L (ref 3.5–5.1)
Sodium: 139 mmol/L (ref 135–145)
Total Bilirubin: 0.5 mg/dL (ref 0.3–1.2)
Total Protein: 6.6 g/dL (ref 6.5–8.1)

## 2020-09-18 LAB — RAPID URINE DRUG SCREEN, HOSP PERFORMED
Amphetamines: NOT DETECTED
Barbiturates: NOT DETECTED
Benzodiazepines: NOT DETECTED
Cocaine: NOT DETECTED
Opiates: NOT DETECTED
Tetrahydrocannabinol: NOT DETECTED

## 2020-09-18 LAB — HEMOGLOBIN A1C
Hgb A1c MFr Bld: 5.3 % (ref 4.8–5.6)
Mean Plasma Glucose: 105.41 mg/dL

## 2020-09-18 LAB — LIPID PANEL
Cholesterol: 175 mg/dL (ref 0–200)
HDL: 32 mg/dL — ABNORMAL LOW (ref >40)
LDL Cholesterol: 102 mg/dL — ABNORMAL HIGH (ref 0–99)
Total CHOL/HDL Ratio: 5.5 RATIO
Triglycerides: 204 mg/dL — ABNORMAL HIGH (ref <150)
VLDL: 41 mg/dL — ABNORMAL HIGH (ref 0–40)

## 2020-09-18 IMAGING — MR MR HEAD W/O CM
6 of 11 series · 24 of 48 positions shown · non-contrast
Comparison: Prior CT from [DATE].

CLINICAL DATA: Initial evaluation for acute stroke. Headache,
left-sided numbness and weakness.



[Series 2: DWI · axial · 3.0mm · 0.94mm/px · z∈[-75,+85]mm · 7 of 110 slices shown (1 of 2)]
[im 1/110]
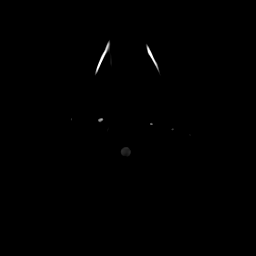
[im 19/110]
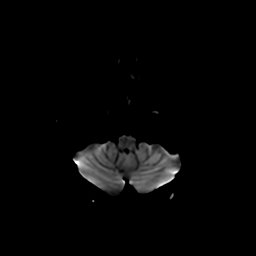
[im 37/110]
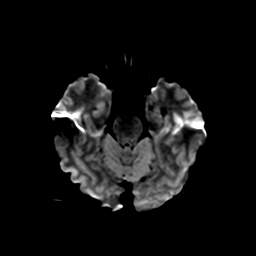
[im 55/110]
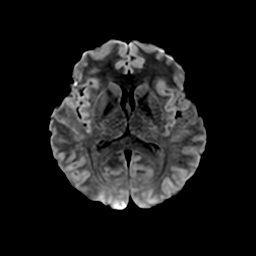
[im 73/110]
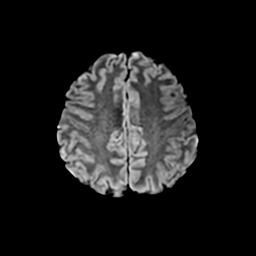
[im 91/110]
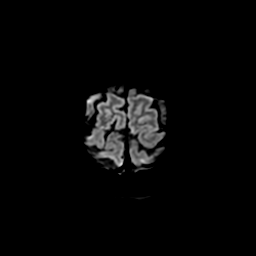
[im 110/110]
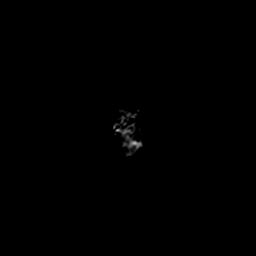

[Series 3: DWI · coronal · 4.0mm · 0.94mm/px · 5 of 74 slices shown (2 of 2)]
[im 1/74]
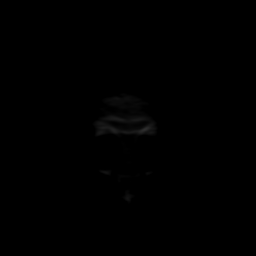
[im 19/74]
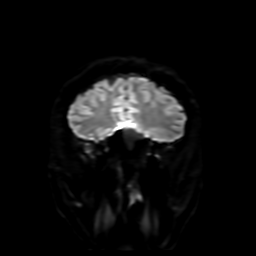
[im 37/74]
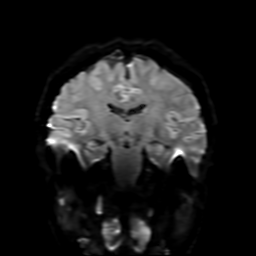
[im 55/74]
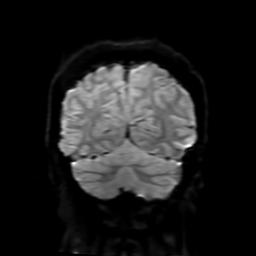
[im 74/74]
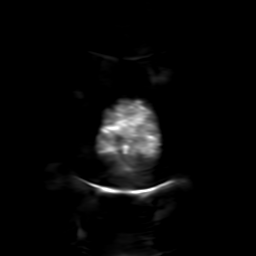

[Series 4: FLAIR · sagittal · 5.0mm · 0.23mm/px · 2 of 29 slices shown (1 of 2)]
[im 1/29]
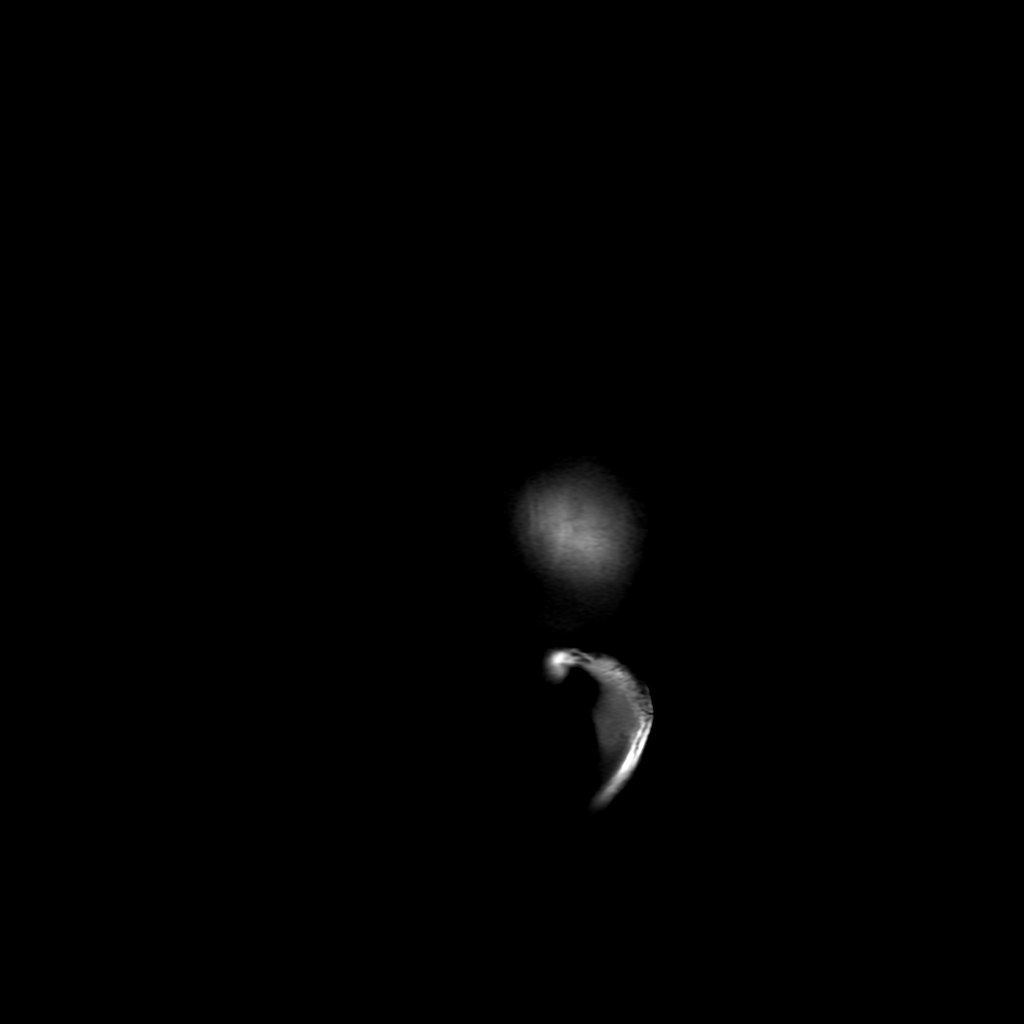
[im 29/29]
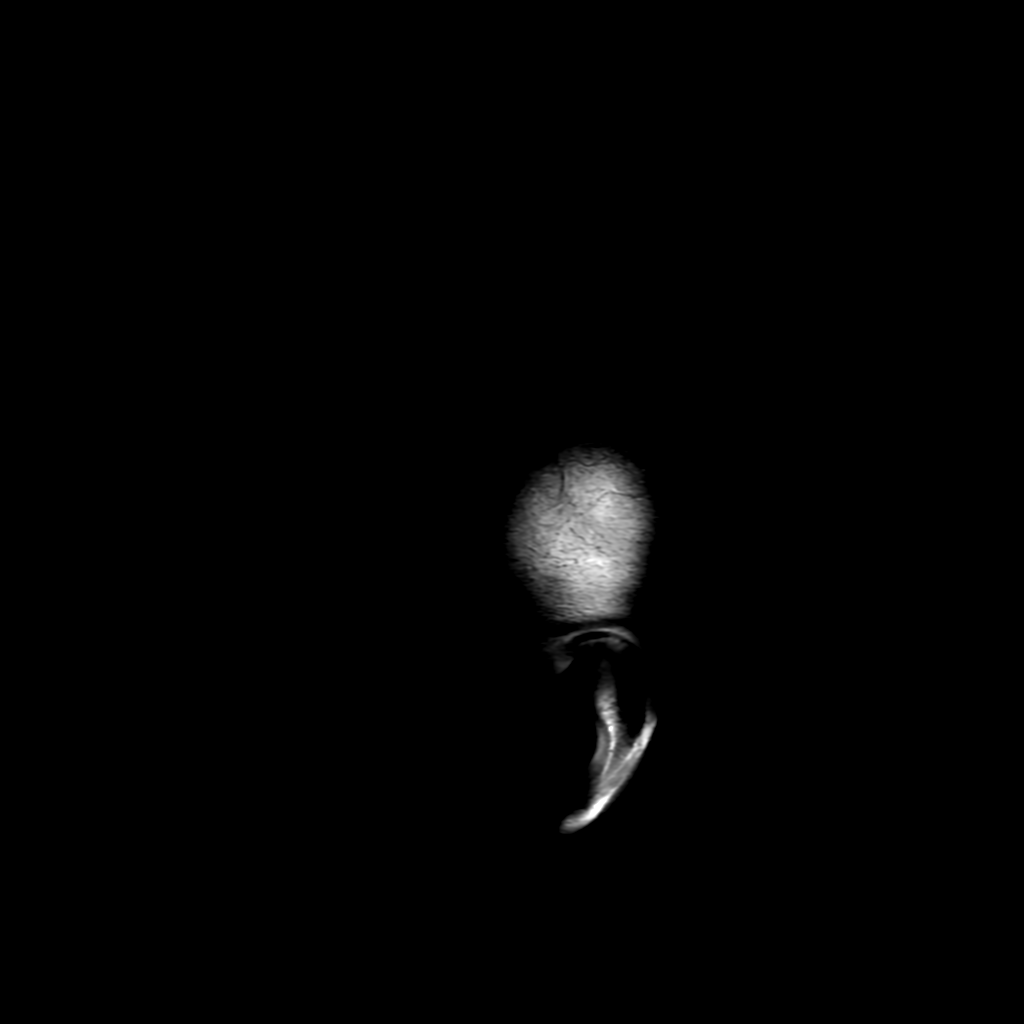

[Series 6: FLAIR · axial · 4.0mm · 0.45mm/px · z∈[-70,+86]mm · 3 of 37 slices shown (2 of 2)]
[im 1/37]
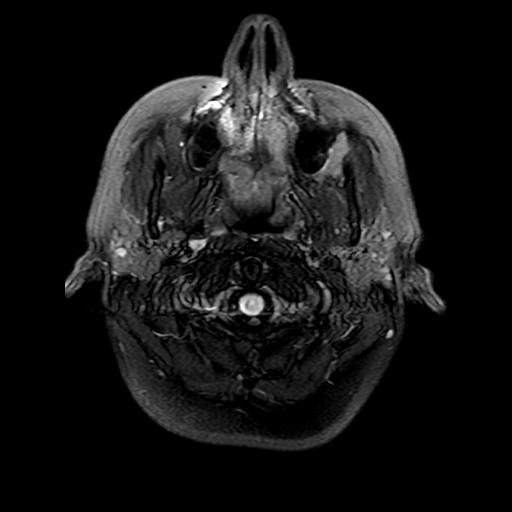
[im 19/37]
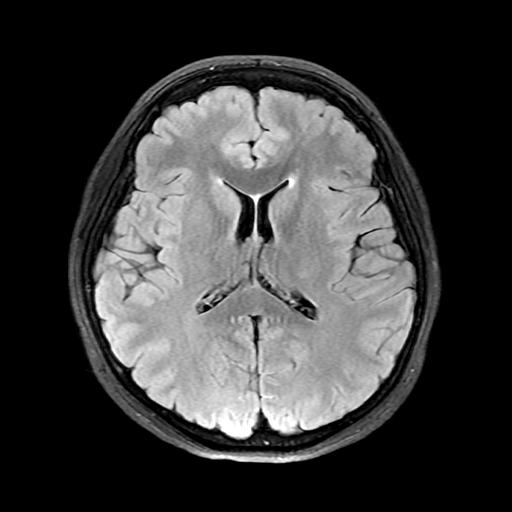
[im 37/37]
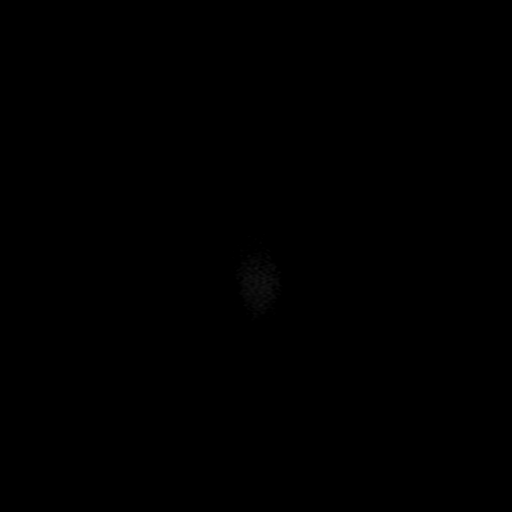

[Series 250: ADC · axial · 3.0mm · 0.94mm/px · z∈[-75,+85]mm · 4 of 55 slices shown (1 of 2)]
[im 1/55]
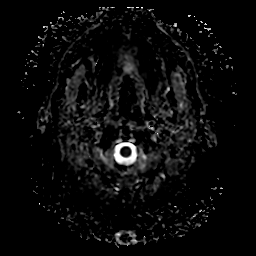
[im 19/55]
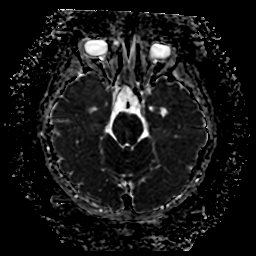
[im 37/55]
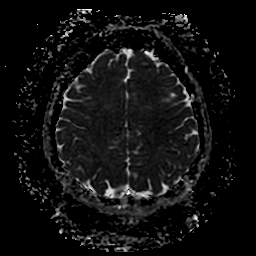
[im 55/55]
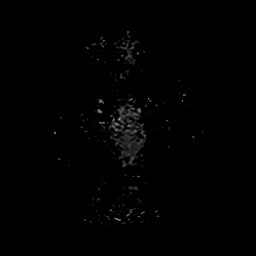

[Series 350: ADC · coronal · 4.0mm · 0.94mm/px · 3 of 37 slices shown (2 of 2)]
[im 1/37]
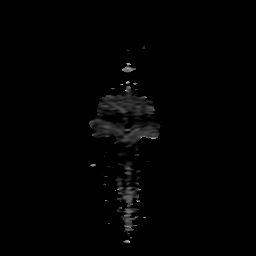
[im 19/37]
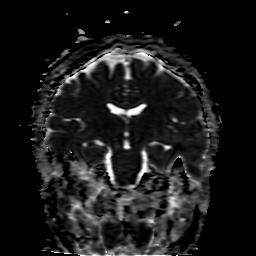
[im 37/37]
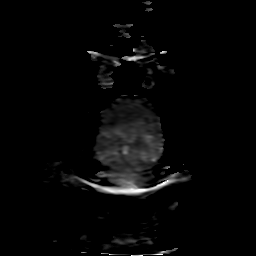

[24 of 48 positions shown; findings below may reference images not displayed]

FINDINGS: MRI HEAD FINDINGS

Brain: Cerebral volume within normal limits for patient age. No
focal parenchymal signal abnormality identified.

No abnormal foci of restricted diffusion to suggest acute or
subacute ischemia. Gray-white matter differentiation well
maintained. No encephalomalacia to suggest chronic infarction. No
foci of susceptibility artifact to suggest acute or chronic
intracranial hemorrhage.

No mass lesion, midline shift or mass effect. No hydrocephalus. No
extra-axial fluid collection. Major dural sinuses are grossly
patent.

Pituitary gland and suprasellar region are normal. Midline
structures intact and normal.

Vascular: Major intracranial vascular flow voids well maintained and
normal in appearance.

Skull and upper cervical spine: Craniocervical junction normal.
Visualized upper cervical spine within normal limits. Bone marrow
signal intensity normal. No scalp soft tissue abnormality.

Sinuses/Orbits: Globes and orbital soft tissues within normal
limits.

Paranasal sinuses are clear. No mastoid effusion. Inner ear
structures normal.

Other: None.

MRA HEAD FINDINGS

ANTERIOR CIRCULATION:

Visualized distal cervical segments of the internal carotid arteries
are widely patent with antegrade flow. Petrous, cavernous, and
supraclinoid segments widely patent bilaterally. 4 mm focal
outpouching extending medially and slightly posteriorly from the
cavernous right ICA consistent with an aneurysm (series 2, image
84). A1 segments widely patent. Normal anterior communicating artery
complex. Anterior cerebral arteries patent to their distal aspects
without stenosis. No M1 stenosis or occlusion. Normal MCA
bifurcations. Distal MCA branches well perfused and symmetric.

POSTERIOR CIRCULATION:

Both V4 segments patent to the vertebrobasilar junction without
stenosis. Right vertebral artery slightly dominant. Both PICA
partially visualized and patent. Basilar patent to its distal aspect
without stenosis. Superior cerebral arteries patent bilaterally.
Both PCAs primarily supplied via the basilar well perfused or distal
aspects. Small left posterior communicating artery noted.

MRA NECK FINDINGS

AORTIC ARCH: Examination somewhat technically limited by motion
artifact and lack of IV contrast.

Aortic arch and origin of the great vessels incompletely assessed on
this exam.

RIGHT CAROTID SYSTEM: Visualized right CCA widely patent to the
bifurcation without stenosis. No significant atheromatous narrowing
or irregularity about the right carotid bulb. Right ICA patent
distally without stenosis, evidence for dissection, or occlusion.

LEFT CAROTID SYSTEM: Visualized left CCA patent to the bifurcation
without stenosis. No significant atheromatous narrowing or
irregularity about the left carotid bulb. Left ICA patent distally
without stenosis, evidence for dissection, or occlusion.

VERTEBRAL ARTERIES: Both vertebral arteries likely arise from the
subclavian arteries. Right vertebral artery slightly dominant.
Visualized vertebral arteries patent without stenosis, evidence for
dissection or occlusion.
IMPRESSION: MRI HEAD IMPRESSION:

Normal brain MRI. No acute intracranial infarct or other
abnormality.

MRA HEAD IMPRESSION:

1. 4 mm cavernous right ICA aneurysm as above.
2. Otherwise normal intracranial MRA.

MRA NECK IMPRESSION:

Normal MRA of the neck.

## 2020-09-18 IMAGING — MR MR MRA HEAD W/O CM
2 series · 17 of 48 positions shown · non-contrast
Comparison: Prior CT from [DATE].

CLINICAL DATA: Initial evaluation for acute stroke. Headache,
left-sided numbness and weakness.



[Series 2: ax (id) · axial · 1.0mm · 0.43mm/px · z∈[-64,+28]mm · 16 of 197 slices shown]
[im 1/197]
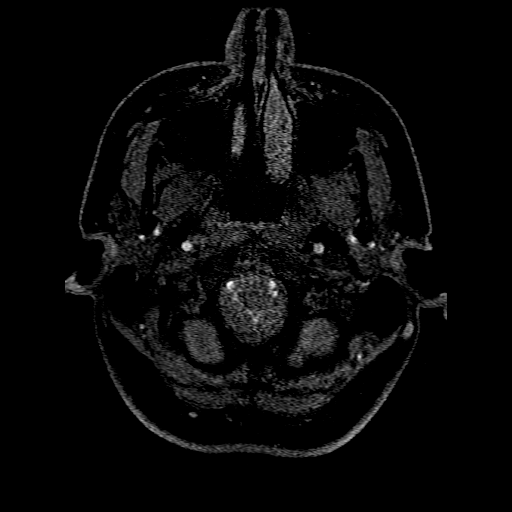
[im 5/197]
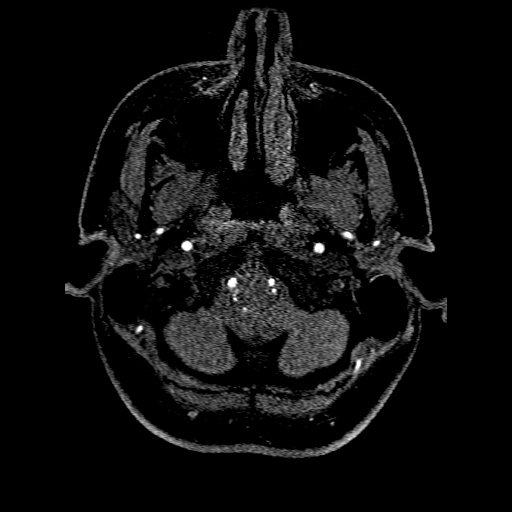
[im 9/197]
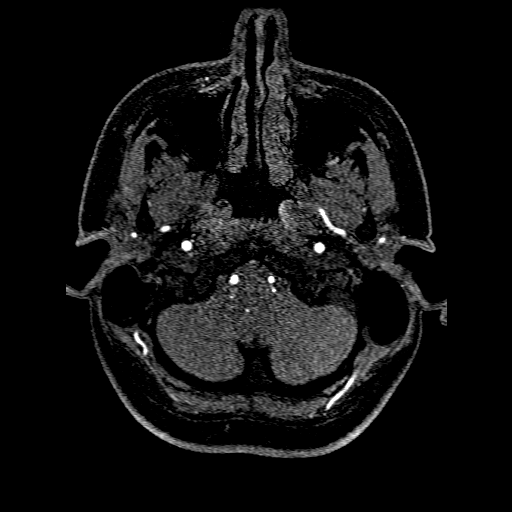
[im 13/197]
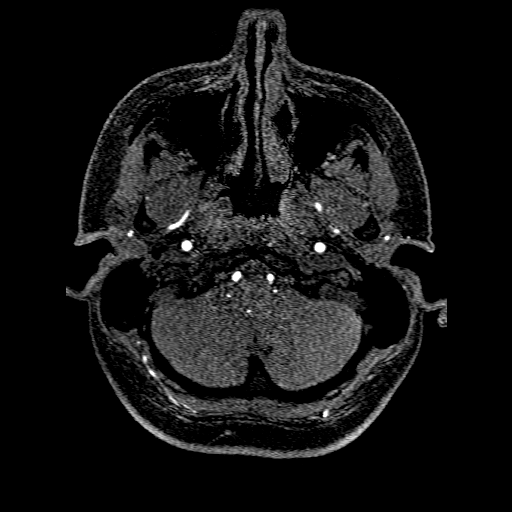
[im 18/197]
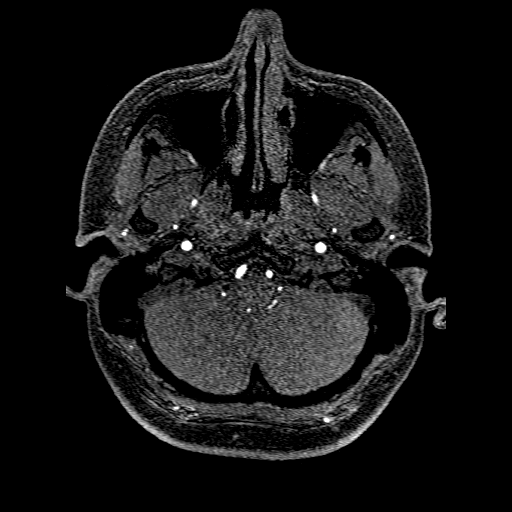
[im 22/197]
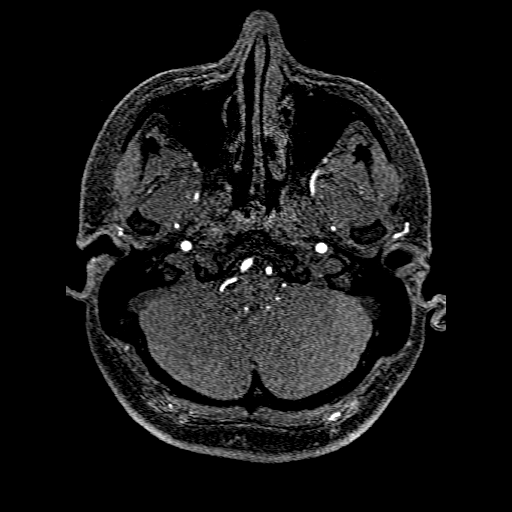
[im 30/197]
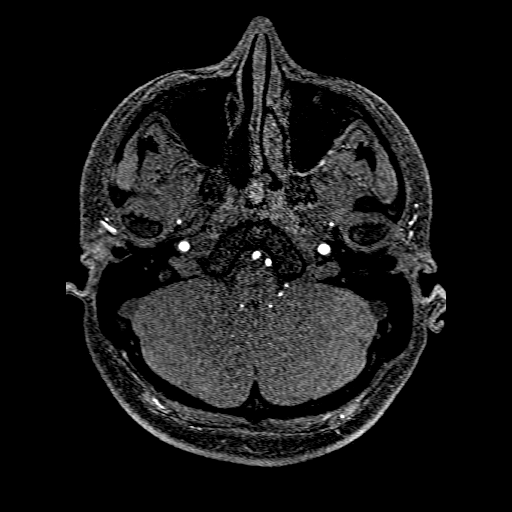
[im 35/197]
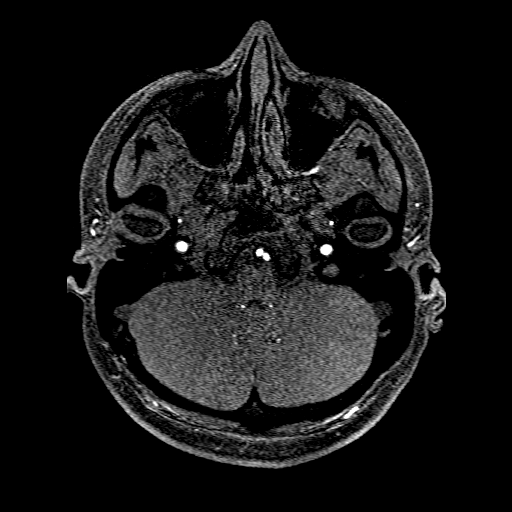
[im 60/197]
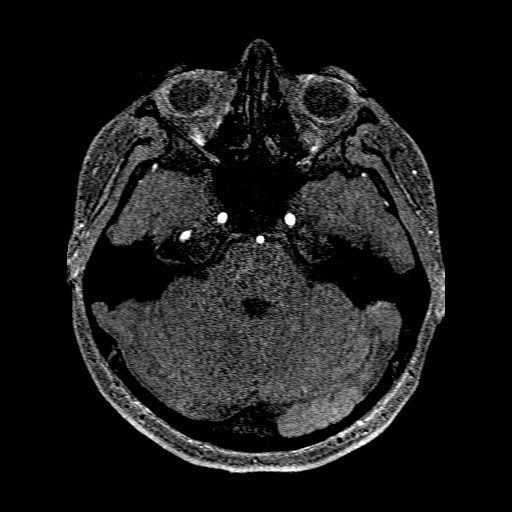
[im 86/197]
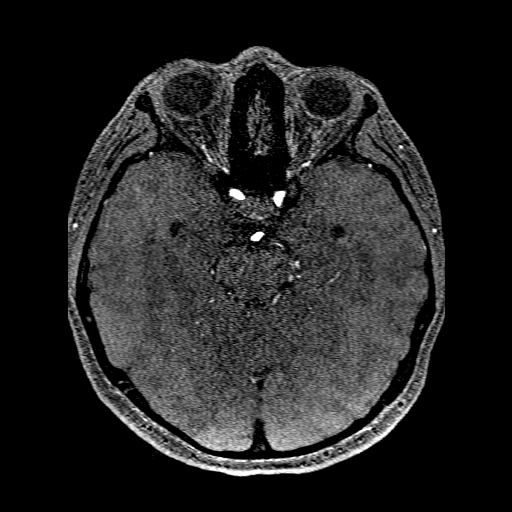
[im 99/197]
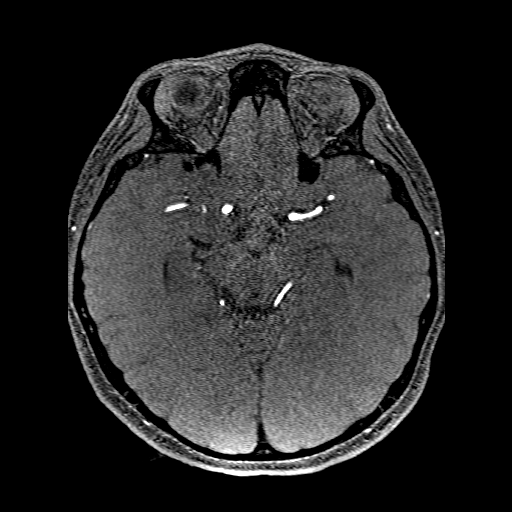
[im 111/197]
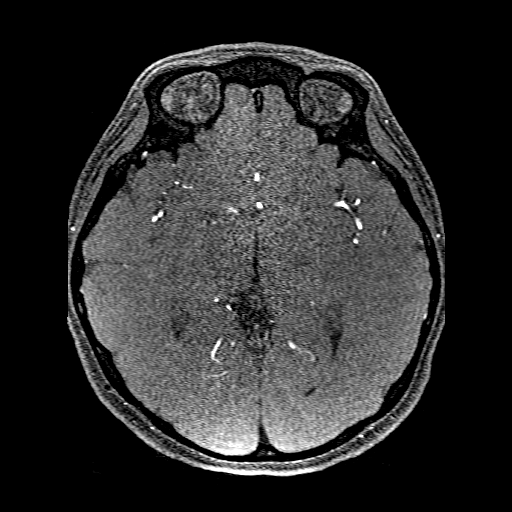
[im 137/197]
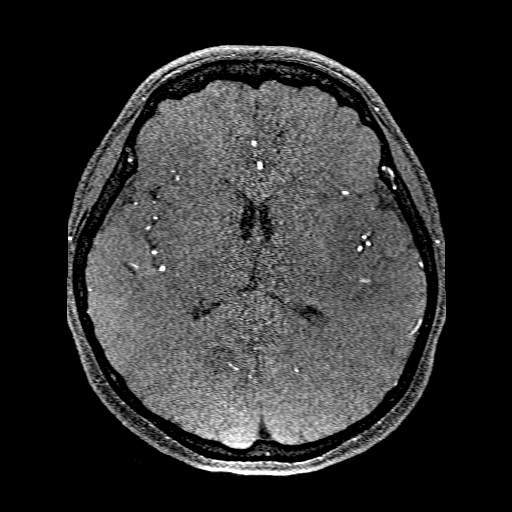
[im 162/197]
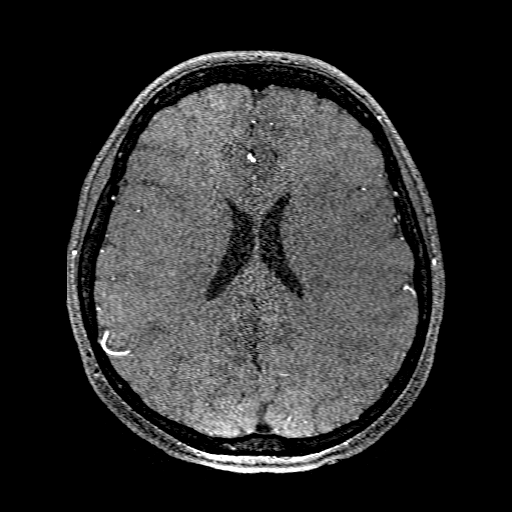
[im 167/197]
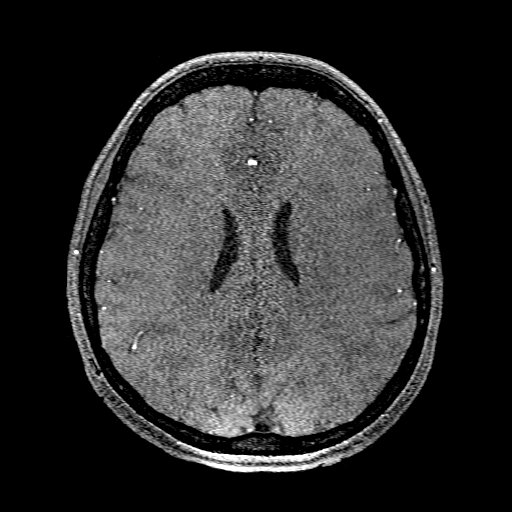
[im 188/197]
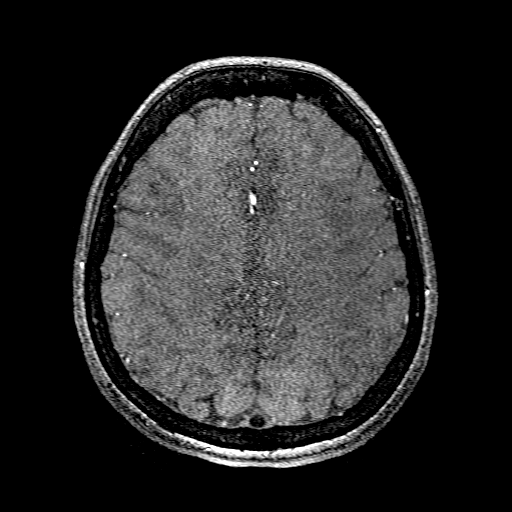

[Series 201: pjn:ax (id) · sagittal · 1.0mm · 0.43mm/px · 1 of 3 slices shown]
[im 1/3]
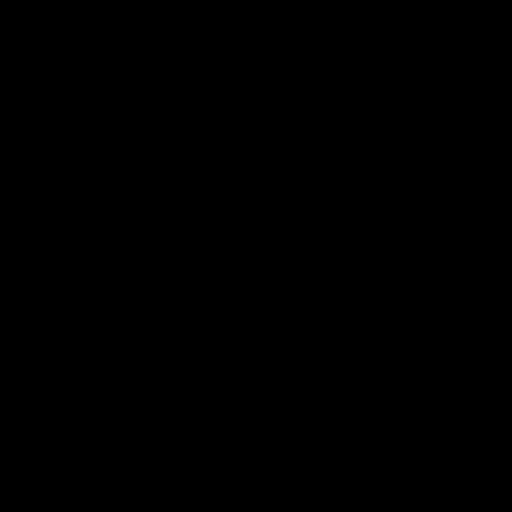

[17 of 48 positions shown; findings below may reference images not displayed]

FINDINGS: MRI HEAD FINDINGS

Brain: Cerebral volume within normal limits for patient age. No
focal parenchymal signal abnormality identified.

No abnormal foci of restricted diffusion to suggest acute or
subacute ischemia. Gray-white matter differentiation well
maintained. No encephalomalacia to suggest chronic infarction. No
foci of susceptibility artifact to suggest acute or chronic
intracranial hemorrhage.

No mass lesion, midline shift or mass effect. No hydrocephalus. No
extra-axial fluid collection. Major dural sinuses are grossly
patent.

Pituitary gland and suprasellar region are normal. Midline
structures intact and normal.

Vascular: Major intracranial vascular flow voids well maintained and
normal in appearance.

Skull and upper cervical spine: Craniocervical junction normal.
Visualized upper cervical spine within normal limits. Bone marrow
signal intensity normal. No scalp soft tissue abnormality.

Sinuses/Orbits: Globes and orbital soft tissues within normal
limits.

Paranasal sinuses are clear. No mastoid effusion. Inner ear
structures normal.

Other: None.

MRA HEAD FINDINGS

ANTERIOR CIRCULATION:

Visualized distal cervical segments of the internal carotid arteries
are widely patent with antegrade flow. Petrous, cavernous, and
supraclinoid segments widely patent bilaterally. 4 mm focal
outpouching extending medially and slightly posteriorly from the
cavernous right ICA consistent with an aneurysm (series 2, image
84). A1 segments widely patent. Normal anterior communicating artery
complex. Anterior cerebral arteries patent to their distal aspects
without stenosis. No M1 stenosis or occlusion. Normal MCA
bifurcations. Distal MCA branches well perfused and symmetric.

POSTERIOR CIRCULATION:

Both V4 segments patent to the vertebrobasilar junction without
stenosis. Right vertebral artery slightly dominant. Both PICA
partially visualized and patent. Basilar patent to its distal aspect
without stenosis. Superior cerebral arteries patent bilaterally.
Both PCAs primarily supplied via the basilar well perfused or distal
aspects. Small left posterior communicating artery noted.

MRA NECK FINDINGS

AORTIC ARCH: Examination somewhat technically limited by motion
artifact and lack of IV contrast.

Aortic arch and origin of the great vessels incompletely assessed on
this exam.

RIGHT CAROTID SYSTEM: Visualized right CCA widely patent to the
bifurcation without stenosis. No significant atheromatous narrowing
or irregularity about the right carotid bulb. Right ICA patent
distally without stenosis, evidence for dissection, or occlusion.

LEFT CAROTID SYSTEM: Visualized left CCA patent to the bifurcation
without stenosis. No significant atheromatous narrowing or
irregularity about the left carotid bulb. Left ICA patent distally
without stenosis, evidence for dissection, or occlusion.

VERTEBRAL ARTERIES: Both vertebral arteries likely arise from the
subclavian arteries. Right vertebral artery slightly dominant.
Visualized vertebral arteries patent without stenosis, evidence for
dissection or occlusion.
IMPRESSION: MRI HEAD IMPRESSION:

Normal brain MRI. No acute intracranial infarct or other
abnormality.

MRA HEAD IMPRESSION:

1. 4 mm cavernous right ICA aneurysm as above.
2. Otherwise normal intracranial MRA.

MRA NECK IMPRESSION:

Normal MRA of the neck.

## 2020-09-18 IMAGING — MR MR MRA NECK W/O CM
1 of 3 series · 17 of 48 positions shown · non-contrast
Comparison: Prior CT from [DATE].

CLINICAL DATA: Initial evaluation for acute stroke. Headache,
left-sided numbness and weakness.



[Series 4: sag inhance (id) · sagittal · 1.2mm · 0.47mm/px · 17 of 357 slices shown]
[im 1/357]
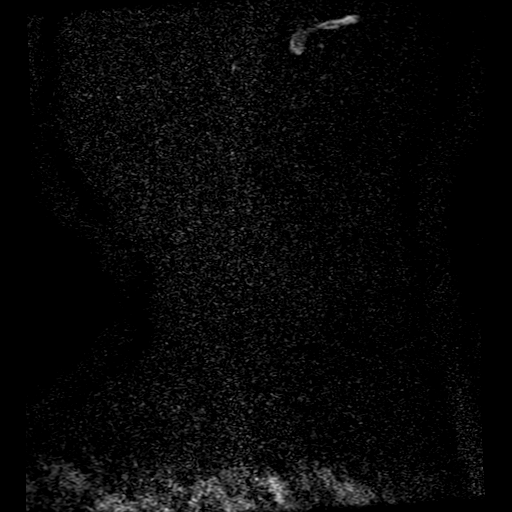
[im 12/357]
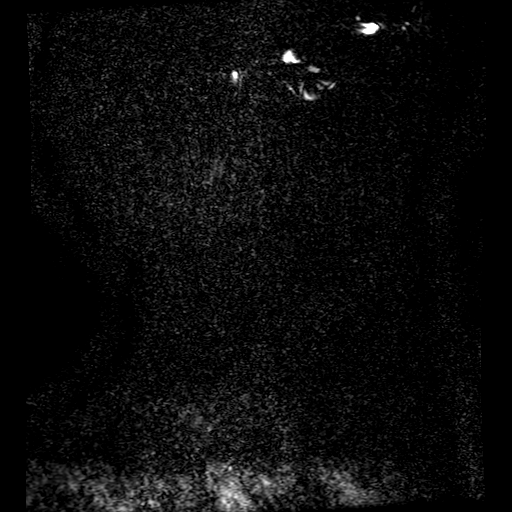
[im 23/357]
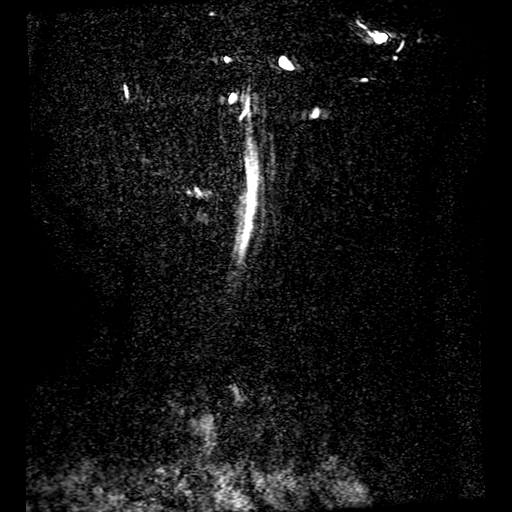
[im 34/357]
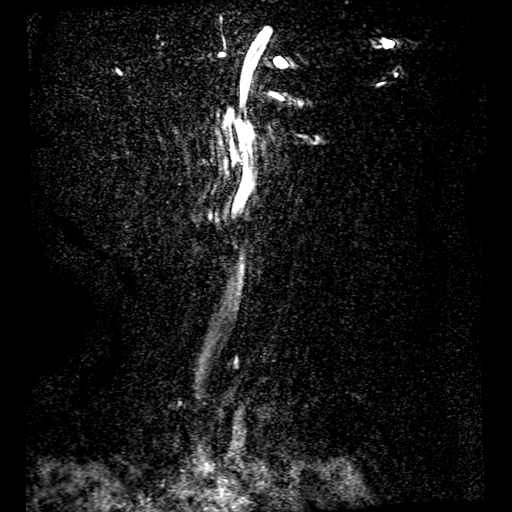
[im 45/357]
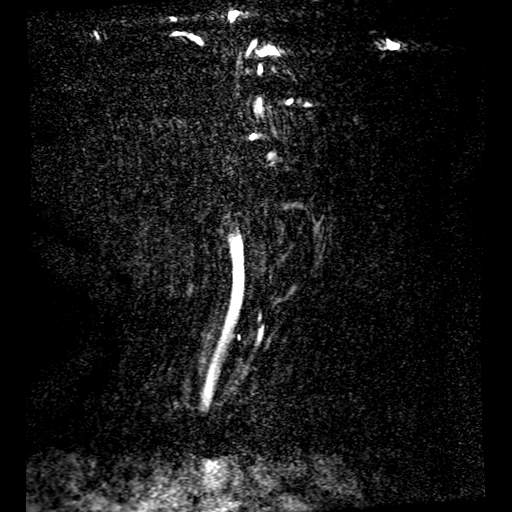
[im 56/357]
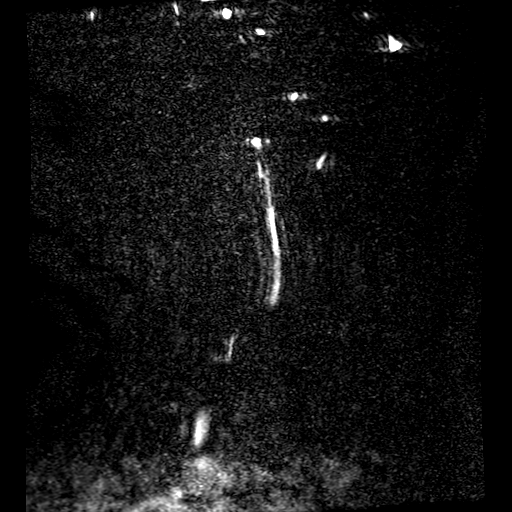
[im 67/357]
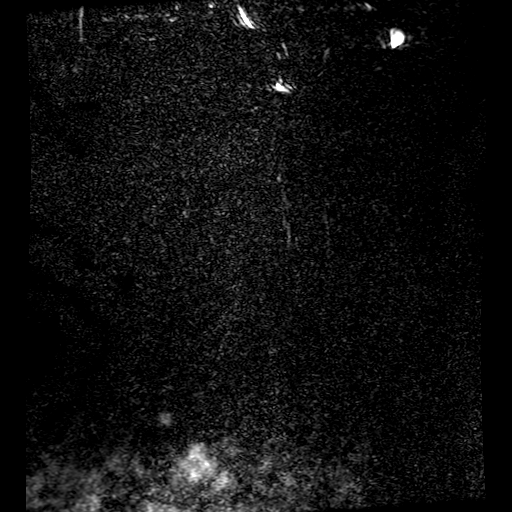
[im 78/357]
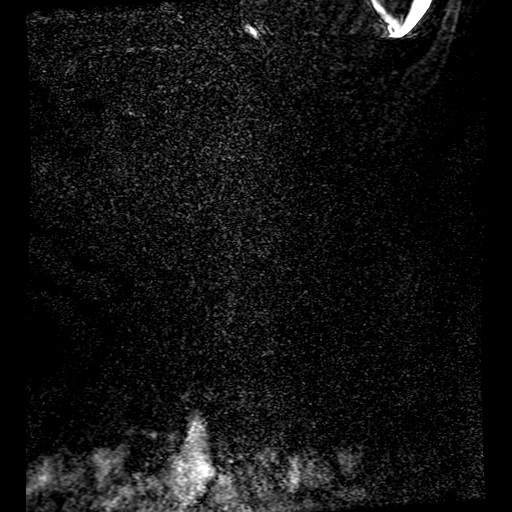
[im 90/357]
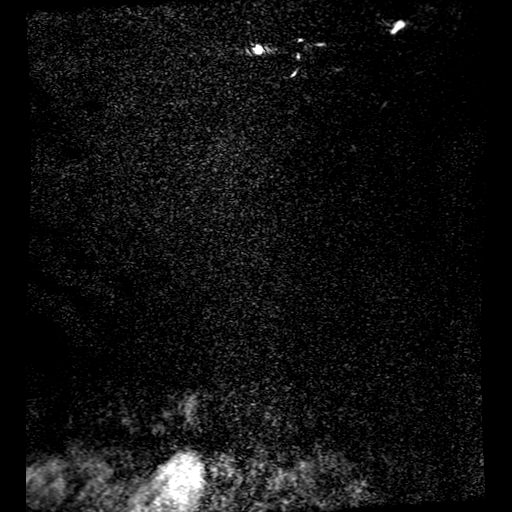
[im 112/357]
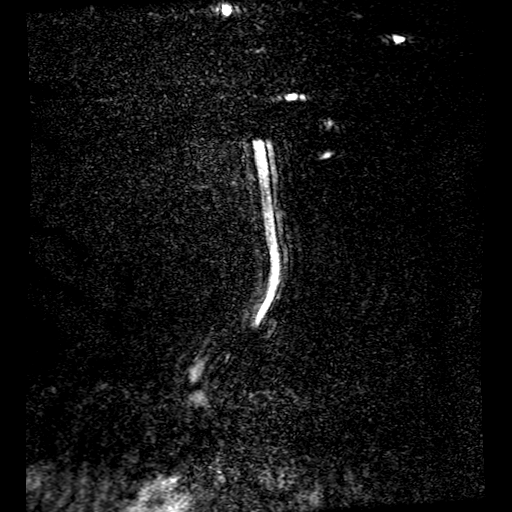
[im 156/357]
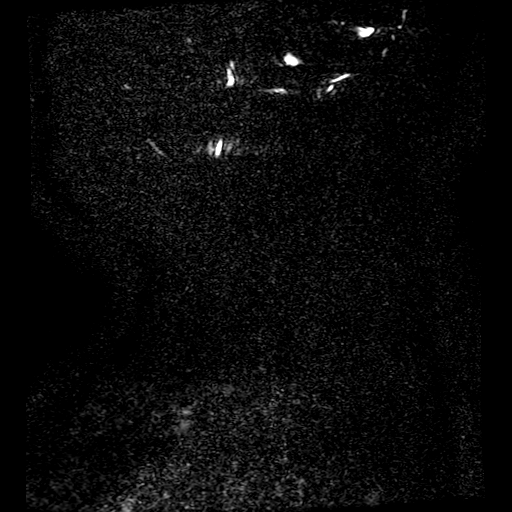
[im 179/357]
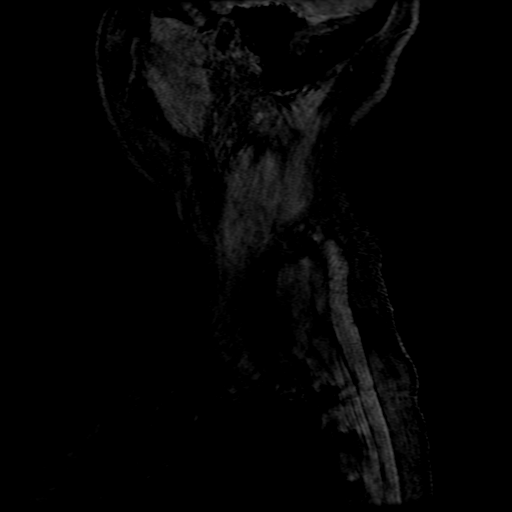
[im 201/357]
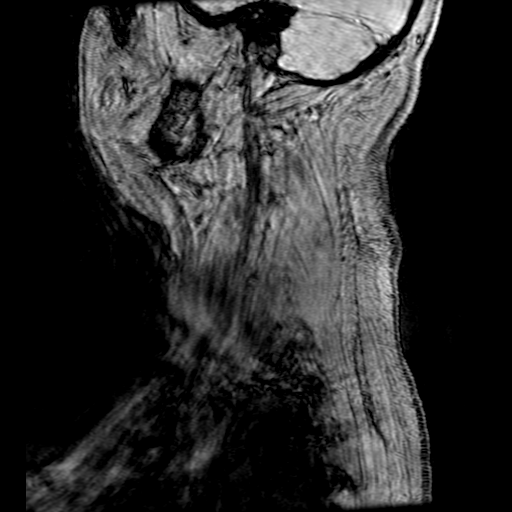
[im 245/357]
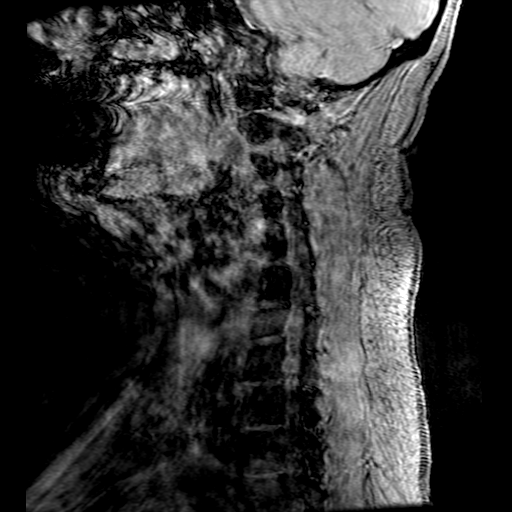
[im 290/357]
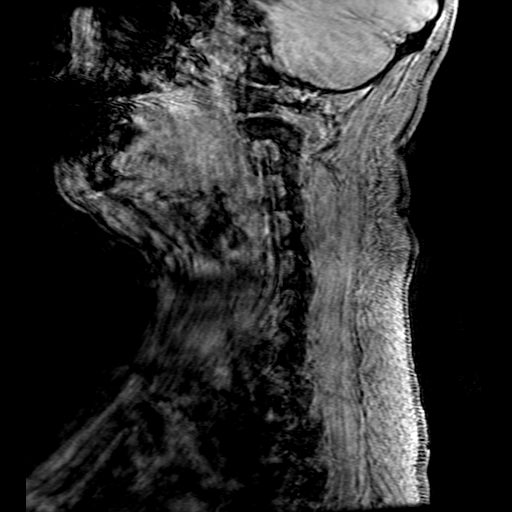
[im 301/357]
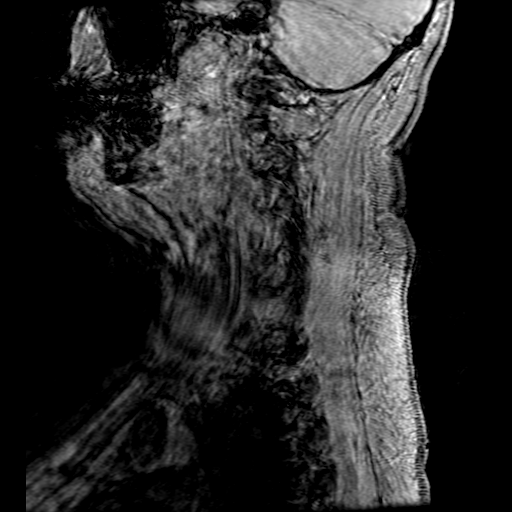
[im 334/357]
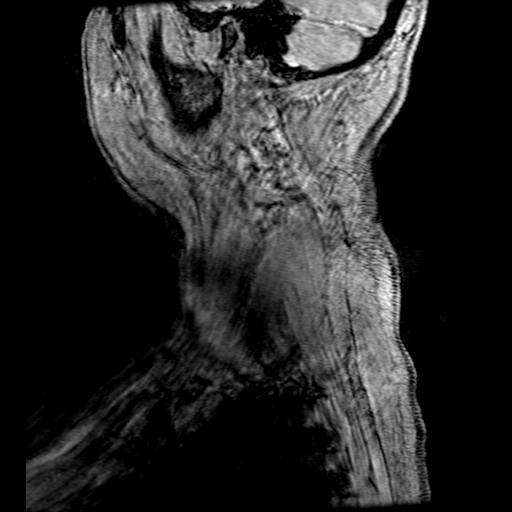

[17 of 48 positions shown; findings below may reference images not displayed]

FINDINGS: MRI HEAD FINDINGS

Brain: Cerebral volume within normal limits for patient age. No
focal parenchymal signal abnormality identified.

No abnormal foci of restricted diffusion to suggest acute or
subacute ischemia. Gray-white matter differentiation well
maintained. No encephalomalacia to suggest chronic infarction. No
foci of susceptibility artifact to suggest acute or chronic
intracranial hemorrhage.

No mass lesion, midline shift or mass effect. No hydrocephalus. No
extra-axial fluid collection. Major dural sinuses are grossly
patent.

Pituitary gland and suprasellar region are normal. Midline
structures intact and normal.

Vascular: Major intracranial vascular flow voids well maintained and
normal in appearance.

Skull and upper cervical spine: Craniocervical junction normal.
Visualized upper cervical spine within normal limits. Bone marrow
signal intensity normal. No scalp soft tissue abnormality.

Sinuses/Orbits: Globes and orbital soft tissues within normal
limits.

Paranasal sinuses are clear. No mastoid effusion. Inner ear
structures normal.

Other: None.

MRA HEAD FINDINGS

ANTERIOR CIRCULATION:

Visualized distal cervical segments of the internal carotid arteries
are widely patent with antegrade flow. Petrous, cavernous, and
supraclinoid segments widely patent bilaterally. 4 mm focal
outpouching extending medially and slightly posteriorly from the
cavernous right ICA consistent with an aneurysm (series 2, image
84). A1 segments widely patent. Normal anterior communicating artery
complex. Anterior cerebral arteries patent to their distal aspects
without stenosis. No M1 stenosis or occlusion. Normal MCA
bifurcations. Distal MCA branches well perfused and symmetric.

POSTERIOR CIRCULATION:

Both V4 segments patent to the vertebrobasilar junction without
stenosis. Right vertebral artery slightly dominant. Both PICA
partially visualized and patent. Basilar patent to its distal aspect
without stenosis. Superior cerebral arteries patent bilaterally.
Both PCAs primarily supplied via the basilar well perfused or distal
aspects. Small left posterior communicating artery noted.

MRA NECK FINDINGS

AORTIC ARCH: Examination somewhat technically limited by motion
artifact and lack of IV contrast.

Aortic arch and origin of the great vessels incompletely assessed on
this exam.

RIGHT CAROTID SYSTEM: Visualized right CCA widely patent to the
bifurcation without stenosis. No significant atheromatous narrowing
or irregularity about the right carotid bulb. Right ICA patent
distally without stenosis, evidence for dissection, or occlusion.

LEFT CAROTID SYSTEM: Visualized left CCA patent to the bifurcation
without stenosis. No significant atheromatous narrowing or
irregularity about the left carotid bulb. Left ICA patent distally
without stenosis, evidence for dissection, or occlusion.

VERTEBRAL ARTERIES: Both vertebral arteries likely arise from the
subclavian arteries. Right vertebral artery slightly dominant.
Visualized vertebral arteries patent without stenosis, evidence for
dissection or occlusion.
IMPRESSION: MRI HEAD IMPRESSION:

Normal brain MRI. No acute intracranial infarct or other
abnormality.

MRA HEAD IMPRESSION:

1. 4 mm cavernous right ICA aneurysm as above.
2. Otherwise normal intracranial MRA.

MRA NECK IMPRESSION:

Normal MRA of the neck.

## 2020-09-18 MED ORDER — PANTOPRAZOLE SODIUM 40 MG PO TBEC: 40.0000 mg | DELAYED_RELEASE_TABLET | Freq: Every day | ORAL | Status: DC

## 2020-09-18 MED ORDER — SUMATRIPTAN SUCCINATE 100 MG PO TABS: 100.0000 mg | ORAL_TABLET | Freq: Once | ORAL | 0 refills | Status: AC | PRN

## 2020-09-18 MED ORDER — ATORVASTATIN CALCIUM 40 MG PO TABS: 40.0000 mg | ORAL_TABLET | Freq: Every day | ORAL | 2 refills | Status: AC

## 2020-09-18 MED ORDER — SUMATRIPTAN SUCCINATE 100 MG PO TABS
100.0000 mg | ORAL_TABLET | Freq: Once | ORAL | Status: AC
  Administered 2020-09-18: 100 mg via ORAL
  Filled 2020-09-18: qty 1

## 2020-09-18 MED ORDER — ENOXAPARIN SODIUM 40 MG/0.4ML IJ SOSY
40.0000 mg | PREFILLED_SYRINGE | INTRAMUSCULAR | Status: DC
  Administered 2020-09-18: 40 mg via SUBCUTANEOUS
  Filled 2020-09-18: qty 0.4

## 2020-09-18 MED ORDER — ATORVASTATIN CALCIUM 40 MG PO TABS
40.0000 mg | ORAL_TABLET | Freq: Every day | ORAL | Status: DC
  Administered 2020-09-18: 40 mg via ORAL
  Filled 2020-09-18: qty 1

## 2020-09-18 MED ORDER — ASPIRIN EC 81 MG PO TBEC: 81.0000 mg | DELAYED_RELEASE_TABLET | Freq: Every day | ORAL | Status: DC

## 2020-09-18 MED ORDER — ASPIRIN 81 MG PO TBEC: 81.0000 mg | DELAYED_RELEASE_TABLET | Freq: Every day | ORAL | 11 refills | Status: DC

## 2020-09-18 NOTE — Evaluation (Signed)
Physical Therapy Evaluation & Discharge Patient Details Name: Brittany Harrison MRN: [id number] DOB: Jun 02, 1995 Today's Date: 09/18/2020   History of Present Illness  25 y/o female presented to [facility] ED on 7/15 with complaint of headache for 3 days and L facial numbness. Received tPA. CT head negative. MRI negative for acute infarct. 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 PMH: ovarian cyst, Raynaud disease  Clinical Impression  PTA, patient lives with mother and daughter and reports independence. Patient currently functioning at independent level with no apparent LOB. Patient scored 24/24 on DGI. No further skilled PT needs required acutely. No PT follow up recommended at this time. PT will sign off.      Follow Up Recommendations No PT follow up    Equipment Recommendations  None recommended by PT    Recommendations for Other Services       Precautions / Restrictions Precautions Precautions: None Restrictions Weight Bearing Restrictions: No      Mobility  Bed Mobility Overal bed mobility: Independent                  Transfers Overall transfer level: Independent                  Ambulation/Gait Ambulation/Gait assistance: Independent Gait Distance (Feet): 200 Feet Assistive device: None Gait Pattern/deviations: WFL(Within Functional Limits)        Stairs Stairs: Yes Stairs assistance: Independent Stair Management: No rails;Alternating pattern;Forwards Number of Stairs: 2    Wheelchair Mobility    Modified Rankin (Stroke Patients Only)       Balance Overall balance assessment: No apparent balance deficits (not formally assessed)                               Standardized Balance Assessment Standardized Balance Assessment : Dynamic Gait Index   Dynamic Gait Index Level Surface: Normal Change in Gait Speed: Normal Gait with Horizontal Head Turns: Normal Gait with Vertical Head Turns:  Normal Gait and Pivot Turn: Normal Step Over Obstacle: Normal Step Around Obstacles: Normal Steps: Normal Total Score: 24       Pertinent Vitals/Pain Pain Assessment: Faces Faces Pain Scale: Hurts even more Pain Location: headache Pain Descriptors / Indicators: Headache Pain Intervention(s): Monitored during session    Home Living Family/patient expects to be discharged to:: Private residence Living Arrangements: Children Available Help at Discharge: Family;Available PRN/intermittently Type of Home: House Home Access: Stairs to enter   Entrance Stairs-Number of Steps: 2 Home Layout: One level Home Equipment: None      Prior Function Level of Independence: Independent         Comments: driving     Hand Dominance        Extremity/Trunk Assessment   Upper Extremity Assessment Upper Extremity Assessment: LUE deficits/detail LUE Deficits / Details: Strength WFL LUE Sensation: decreased light touch    Lower Extremity Assessment Lower Extremity Assessment: LLE deficits/detail LLE Deficits / Details: Strength WFL LLE Sensation: decreased light touch    Cervical / Trunk Assessment Cervical / Trunk Assessment: Normal  Communication   Communication: No difficulties  Cognition Arousal/Alertness: Awake/alert Behavior During Therapy: WFL for tasks assessed/performed Overall Cognitive Status: Within Functional Limits for tasks assessed  General Comments      Exercises     Assessment/Plan    PT Assessment Patent does not need any further PT services  PT Problem List         PT Treatment Interventions      PT Goals (Current goals can be found in the Care Plan section)  Acute Rehab PT Goals Patient Stated Goal: to go home PT Goal Formulation: All assessment and education complete, DC therapy    Frequency     Barriers to discharge        Co-evaluation               AM-PAC PT "6  Clicks" Mobility  Outcome Measure Help needed turning from your back to your side while in a flat bed without using bedrails?: None Help needed moving from lying on your back to sitting on the side of a flat bed without using bedrails?: None Help needed moving to and from a bed to a chair (including a wheelchair)?: None Help needed standing up from a chair using your arms (e.g., wheelchair or bedside chair)?: None Help needed to walk in hospital room?: None Help needed climbing 3-5 steps with a railing? : None 6 Click Score: 24    End of Session   Activity Tolerance: Patient tolerated treatment well Patient left: in bed;with call bell/phone within reach;with nursing/sitter in room Nurse Communication: Mobility status PT Visit Diagnosis: Muscle weakness (generalized) (M62.81)    Time: 7425-9563 PT Time Calculation (min) (ACUTE ONLY): 14 min   Charges:   PT Evaluation $PT Eval Low Complexity: 1 Low          Jesica Goheen A. Dan Humphreys PT, DPT Acute Rehabilitation Services Pager [phone] Office [phone]   Viviann Spare 09/18/2020, 10:34 AM

## 2020-09-18 NOTE — Progress Notes (Signed)
STROKE TEAM PROGRESS NOTE   INTERVAL HISTORY Her RN  is at the bedside.  Patient is sitting up comfortably in bed.  She still has moderate headache but numbness and tingling appear to have improved.  She does give history of longstanding migraine headaches with has not had neurological complications with prior migraines.  MRI scan of the brain is negative for acute stroke.  MR angiogram of the brain shows no large vessel stenosis or occlusion.  Small incidental 4 mm right cavernous ICA aneurysm is noted.  Echocardiogram is pending.  Patient does admit to significant psychosocial stress underlying which may have triggered this episode  Vitals:   09/17/20 2216 09/17/20 2307 09/18/20 0301 09/18/20 0744  BP: (!) 133/94 (!) 124/91 116/79 113/80  Pulse: 75 73 74 63  Resp: 18 17 20    Temp: (!) 97.5 F (36.4 C) 97.8 F (36.6 C) 98.3 F (36.8 C) 98.1 F (36.7 C)  TempSrc: Oral Oral Oral   SpO2: 99% 99% 98% 99%  Weight: 84.1 kg     Height: 5\' 9"  (1.753 m)      CBC:  Recent Labs  Lab 09/17/20 0342 09/18/20 0407  WBC 7.5 6.3  NEUTROABS 4.4  --   HGB 14.5 14.6  HCT 41.8 42.3  MCV 88.7 88.7  PLT 248 238   Basic Metabolic Panel:  Recent Labs  Lab 09/17/20 0342 09/18/20 0407  NA 139 139  K 3.5 3.7  CL 107 109  CO2 26 23  GLUCOSE 107* 111*  BUN 7 6  CREATININE 0.65 0.73  CALCIUM 9.4 9.6   Lipid Panel:  Recent Labs  Lab 09/18/20 0407  CHOL 175  TRIG 204*  HDL 32*  CHOLHDL 5.5  VLDL 41*  LDLCALC 102*   HgbA1c:  Recent Labs  Lab 09/18/20 0407  HGBA1C 5.3   Urine Drug Screen:  Recent Labs  Lab 09/18/20 0700  LABOPIA NONE DETECTED  COCAINSCRNUR NONE DETECTED  LABBENZ NONE DETECTED  AMPHETMU NONE DETECTED  THCU NONE DETECTED  LABBARB NONE DETECTED    Alcohol Level  Recent Labs  Lab 09/17/20 0342  ETH <10    IMAGING past 24 hours MR ANGIO HEAD WO CONTRAST  Result Date: 09/18/2020 IMPRESSION: MRI HEAD IMPRESSION: Normal brain MRI. No acute intracranial  infarct or other abnormality.   MRA HEAD IMPRESSION:  1. 4 mm cavernous right ICA aneurysm  2. Otherwise normal intracranial MRA.   MRA NECK IMPRESSION: Normal MRA of the neck.   CT head Result date: 09/17/2020 IMPRESSION: Normal noncontrast Head CT.  ASPECTS 10.  PHYSICAL EXAM Pleasant young Caucasian lady not in distress. . Afebrile. Head is nontraumatic. Neck is supple without bruit.    Cardiac exam no murmur or gallop. Lungs are clear to auscultation. Distal pulses are well felt.  Neurological Exam ;  Awake  Alert oriented x 3. Normal speech and language.eye movements full without nystagmus.fundi were not visualized. Vision acuity and fields appear normal. Hearing is normal. Palatal movements are normal. Face symmetric. Tongue midline. Normal strength, tone, reflexes and coordination. Normal sensation. Gait deferred.  ASSESSMENT/PLAN [name] is a 25 y.o. female with history of anxiety and depression untreated, and chronic headaches, presenting with left lip numbness, blurry left vision concerning for a stroke.  tPA was given on 09/17/2020 at 4:33am.  CT head with no acute abnorality.  MRI shows incidental finding of 4 mm cavernous right ICA aneurysm  Stroke like episode s/p IV  tPA treatment likely episode of complicated  migraine Code Stroke CT head: No acute abnormality. ASPECTS 10.    MRI  Normal brain MRI. No acute intracranial infarct MRA neck: normal MRA head: 4 mm cavernous right ICA aneurysm  2D Echo ejection fraction 60-65%.  No cardiac source of embolism LDL 102 HgbA1c 5.3 VTE prophylaxis - Lovenox 40mg  daily Diet: heart healthy No antithrombotic prior to admission, now on aspirin 81 mg daily Therapy recommendations:  PT no needs Disposition:  Likely home today  Complicated migraines 3 day history of headaches, has intermittent headaches at baseline but has been increasingly worse due to stress factors at home.  Takes Excedrin prn. Imitrex 100mg  x1 given, if  headache improves will send patient home with prescription for Imitrex Follow-up with Neurology at outpatient clinic in 1 month  Hypertension Home meds:  none Stable Permissive hypertension (OK if < 220/120) but gradually normalize in 5-7 days Long-term BP goal normotensive  Hyperlipidemia Home meds:  none LDL 102, goal < 70 Add atorvastatin 40mg  daily  Continue statin at discharge   Other Stroke Risk Factors Cigarette smoker, smokes an average of 0.5 packs per dayadvised to stop smoking  Other Active Problems Incidental finding: 4 mm cavernous right ICA aneurysm on aneurysm. Outpatient follow-up with PCP Raynaud's disease Headaches Increased stress and anxiety: encouraged exercise, yoga, and meditation  Hospital day # 1  Lissy Olivencia-Simmons, ACNP-BC Stroke NP  I have personally obtained history,examined this patient, reviewed notes, independently viewed imaging studies, participated in medical decision making and plan of care.ROS completed by me personally and pertinent positives fully documented  I have made any additions or clarifications directly to the above note. Agree with note above.  Patient presented with a strokelike episode and received IV tPA and has made good recovery but is still having some headache.  Continue strict blood pressure control as per post tPA protocol.  Imitrex 100 mg for symptomatic relief of headache.  Mobilize out of bed.  Therapy consults.  May consider possible discharge later today if stable.  Patient was counseled to increase participation in stress relaxation activities like meditation and yoga and regular exercise.This patient is critically ill and at significant risk of neurological worsening, death and care requires constant monitoring of vital signs, hemodynamics,respiratory and cardiac monitoring, extensive review of multiple databases, frequent neurological assessment, discussion with family, other specialists and medical decision making of  high complexity.I have made any additions or clarifications directly to the above note.This critical care time does not reflect procedure time, or teaching time or supervisory time of PA/NP/Med Resident etc but could involve care discussion time.  I spent 30 minutes of neurocritical care time  in the care of  this patient.     , MD Medical Director [facility] Pager: [phone] 09/18/2020 1:32 PM  To contact Stroke Continuity provider, please refer to [facility]. After hours, contact General Neurology

## 2020-09-18 NOTE — Evaluation (Signed)
Occupational Therapy Evaluation Patient Details Name: Brittany Harrison MRN: [id number] DOB: 1995/05/09 Today's Date: 09/18/2020    History of Present Illness Pt is 25 y/o female presented to [facility] ED on 7/15 with complaint of headache for 3 days and L facial numbness. Received tPA. CT head negative. MRI negative for acute infarct. 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 PMH: ovarian cyst, Raynaud disease   Clinical Impression   Pt is Ox4, demos good insight, describing residual deficits of impairments in light touch and decreased strength to LEU. Grossly pt performing all ADL's at Indep level. Noted sensitivity to light and slight L lateral field cut with visual screen but does not appear to impact safety with transfers and ADL's. After education on modification and FM/GM strengthening activity, anticipate pt to be able to d/c to home safely with no acute or post acute OT indicated at this time. OT will sign off.     Follow Up Recommendations  Supervision - Intermittent    Equipment Recommendations  None recommended by OT    Recommendations for Other Services       Precautions / Restrictions Precautions Precautions: None Restrictions Weight Bearing Restrictions: No      Mobility Bed Mobility Overal bed mobility: Independent                  Transfers Overall transfer level: Independent                    Balance Overall balance assessment: No apparent balance deficits (not formally assessed)                               Standardized Balance Assessment Standardized Balance Assessment : Dynamic Gait Index   Dynamic Gait Index Level Surface: Normal Change in Gait Speed: Normal Gait with Horizontal Head Turns: Normal Gait with Vertical Head Turns: Normal Gait and Pivot Turn: Normal Step Over Obstacle: Normal Step Around Obstacles: Normal Steps: Normal Total Score: 24     ADL either performed or assessed with  clinical judgement   ADL Overall ADL's : Independent                                             Vision   Vision Assessment?: Yes Additional Comments: discomfort with L eye scanning to L; slight ?field cut ~10 degrees from L but good compensation. discomfort and sensativity to bright light.     Perception     Praxis      Pertinent Vitals/Pain Pain Assessment: 0-10 Pain Score: 4  Faces Pain Scale: Hurts even more Pain Location: headache Pain Descriptors / Indicators: Aching Pain Intervention(s): Monitored during session     Hand Dominance     Extremity/Trunk Assessment Upper Extremity Assessment Upper Extremity Assessment: LUE deficits/detail LUE Deficits / Details: grossly symmetrical strength, self reports feelings of strength impairment LUE Sensation: decreased light touch   Lower Extremity Assessment Lower Extremity Assessment: Defer to PT evaluation LLE Deficits / Details: Strength WFL LLE Sensation: decreased light touch   Cervical / Trunk Assessment Cervical / Trunk Assessment: Normal   Communication Communication Communication: No difficulties   Cognition Arousal/Alertness: Awake/alert Behavior During Therapy: WFL for tasks assessed/performed Overall Cognitive Status: Within Functional Limits for tasks assessed  General Comments       Exercises     Shoulder Instructions      Home Living Family/patient expects to be discharged to:: Private residence Living Arrangements: Children Available Help at Discharge: Family;Available PRN/intermittently Type of Home: House Home Access: Stairs to enter [employer] of Steps: 2   Home Layout: One level     Bathroom Shower/Tub: Chief Strategy Officer: Standard     Home Equipment: None          Prior Functioning/Environment Level of Independence: Independent        Comments: driving        OT Problem  List:        OT Treatment/Interventions:      OT Goals(Current goals can be found in the care plan section) Acute Rehab OT Goals Patient Stated Goal: to leave today OT Goal Formulation: All assessment and education complete, DC therapy Time For Goal Achievement: 09/18/20 Potential to Achieve Goals: Good  OT Frequency:     Barriers to D/C:            Co-evaluation              AM-PAC OT "6 Clicks" Daily Activity     Outcome Measure Help from another person eating meals?: None Help from another person taking care of personal grooming?: None Help from another person toileting, which includes using toliet, bedpan, or urinal?: None Help from another person bathing (including washing, rinsing, drying)?: None Help from another person to put on and taking off regular upper body clothing?: None Help from another person to put on and taking off regular lower body clothing?: None 6 Click Score: 24   End of Session Equipment Utilized During Treatment: Other (comment) (None)  Activity Tolerance: Patient tolerated treatment well Patient left: in bed;with call bell/phone within reach  OT Visit Diagnosis: Hemiplegia and hemiparesis Hemiplegia - Right/Left: Left Hemiplegia - dominant/non-dominant: Non-Dominant Hemiplegia - caused by: Other cerebrovascular disease                Time: 1000-1020 OT Time Calculation (min): 20 min Charges:  OT General Charges $OT Visit: 1 Visit OT Evaluation $OT Eval Low Complexity: 1 Low  Khaleb Broz OTR/L acute rehab services Office: [phone]  09/18/2020, 12:45 PM

## 2020-09-18 NOTE — Discharge Instructions (Addendum)
? ?  ? ?  Outpatient Psychiatry and Counseling ? ?[facility]: Mobile Crisis Management:  [phone] ? ?[facility]         ?[address] ?[place], [place] [address] ?[phone] ? ?[facility] sliding scale fee and walk in schedule: M-F 8am-12pm/1pm-3pm ?[address] ?[place], [place] [address] ?[phone] ? ?[facility] Intensive Outpatient Therapy Program ?[address] ?[place], [place] [address] ?[phone] ? ?[facility]   [facility] ?[address]   [address] ?[place], [place] [address]    [place], [place] [address] ?[phone]     [phone] ? ?[facility]               [facility] ?[address]  [address] ?[place] [address]    [place] [address] ?[phone]     [phone] ? ?Parish McKinney, MD    [facility] ?[address]    [address] ?[place] [address]    [place] [address] ?[phone]       [phone] ? ?[facility]   [facility] ?[address]   [address] ?[place]     [place], [place] ?[phone]     [phone] ? ?[facility]    [facility] ?[address]    Shamsher Ahluwalia, MD ?[place], [place]                  [address] ?[phone]     [place], [place] [address] ?[phone] ?[facility]: (Spanish speakin) ?[phone] ? ?[facility]    [facility] ?[address]    [address] ?[place], [place] [address]    [place], [place] [address] ?[phone]     [phone]  ?

## 2020-09-18 NOTE — Discharge Summary (Addendum)
Stroke Discharge Summary  Patient ID: Brittany Harrison   MRN: [id number]      DOB: [birth year]  Date of Admission: 09/17/2020 Date of Discharge: 09/18/2020  Attending Physician:  Stroke, Md, MD, Stroke MD Consultant(s):    none Patient's PCP:  Pcp, No  DISCHARGE DIAGNOSIS:  Active Problems:   Complicated Migraine episode   Stroke-like episode   Acute ischemic stroke (HCC)   Anxiety H/o migraines   Received tissue plasminogen activator (tPA) less than 24 hours prior to arrival   Allergies as of 09/18/2020       Reactions   Codeine Other (See Comments)   Unknown - childhood reaction per pt; Patient reports getting sick/nauseous    Sulfa Antibiotics Other (See Comments)   Unknown - childhood reaction per pt        Medication List     STOP taking these medications    morphine 15 MG tablet Commonly known as: MSIR   ondansetron 4 MG disintegrating tablet Commonly known as: Zofran ODT   tamsulosin 0.4 MG Caps capsule Commonly known as: Flomax       TAKE these medications    aspirin 81 MG EC tablet Take 1 tablet (81 mg total) by mouth daily. Swallow whole.   atorvastatin 40 MG tablet Commonly known as: LIPITOR Take 1 tablet (40 mg total) by mouth daily. Start taking on: September 19, 2020   SUMAtriptan 100 MG tablet Commonly known as: IMITREX Take 1 tablet (100 mg total) by mouth once as needed for up to 16 doses for migraine. May repeat in 2 hours if headache persists or recurs.  Do not take more than 2 per day and no more than 2 days per week.        LABORATORY STUDIES CBC    Component Value Date/Time   WBC 6.3 09/18/2020 0407   RBC 4.77 09/18/2020 0407   HGB 14.6 09/18/2020 0407   HCT 42.3 09/18/2020 0407   PLT 238 09/18/2020 0407   MCV 88.7 09/18/2020 0407   MCH 30.6 09/18/2020 0407   MCHC 34.5 09/18/2020 0407   RDW 11.8 09/18/2020 0407   LYMPHSABS 2.4 09/17/2020 0342   MONOABS 0.5 09/17/2020 0342   EOSABS 0.2 09/17/2020 0342   BASOSABS 0.0  09/17/2020 0342   CMP    Component Value Date/Time   NA 139 09/18/2020 0407   K 3.7 09/18/2020 0407   CL 109 09/18/2020 0407   CO2 23 09/18/2020 0407   GLUCOSE 111 (H) 09/18/2020 0407   BUN 6 09/18/2020 0407   CREATININE 0.73 09/18/2020 0407   CALCIUM 9.6 09/18/2020 0407   PROT 6.6 09/18/2020 0407   ALBUMIN 3.8 09/18/2020 0407   AST 28 09/18/2020 0407   ALT 42 09/18/2020 0407   ALKPHOS 52 09/18/2020 0407   BILITOT 0.5 09/18/2020 0407   GFRNONAA >60 09/18/2020 0407   GFRAA >60 11/15/2016 1314   COAGS Lab Results  Component Value Date   INR 1.0 09/17/2020   Lipid Panel    Component Value Date/Time   CHOL 175 09/18/2020 0407   TRIG 204 (H) 09/18/2020 0407   HDL 32 (L) 09/18/2020 0407   CHOLHDL 5.5 09/18/2020 0407   VLDL 41 (H) 09/18/2020 0407   LDLCALC 102 (H) 09/18/2020 0407   HgbA1C  Lab Results  Component Value Date   HGBA1C 5.3 09/18/2020   Urinalysis    Component Value Date/Time   COLORURINE ORANGE (A) 11/15/2016 1335   APPEARANCEUR CLOUDY (A) 11/15/2016 1335  LABSPEC 1.020 11/15/2016 1335   PHURINE 8.0 11/15/2016 1335   GLUCOSEU 100 (A) 11/15/2016 1335   HGBUR TRACE (A) 11/15/2016 1335   BILIRUBINUR SMALL (A) 11/15/2016 1335   KETONESUR 15 (A) 11/15/2016 1335   PROTEINUR 100 (A) 11/15/2016 1335   UROBILINOGEN 0.2 09/06/2014 1600   NITRITE POSITIVE (A) 11/15/2016 1335   LEUKOCYTESUR TRACE (A) 11/15/2016 1335   Urine Drug Screen     Component Value Date/Time   LABOPIA NONE DETECTED 09/18/2020 0700   COCAINSCRNUR NONE DETECTED 09/18/2020 0700   LABBENZ NONE DETECTED 09/18/2020 0700   AMPHETMU NONE DETECTED 09/18/2020 0700   THCU NONE DETECTED 09/18/2020 0700   LABBARB NONE DETECTED 09/18/2020 0700    Alcohol Level    Component Value Date/Time   ETH <10 09/17/2020 0342     SIGNIFICANT DIAGNOSTIC STUDIES MR ANGIO HEAD WO CONTRAST  Result Date: 09/18/2020 CLINICAL DATA:  Initial evaluation for acute stroke. Headache, left-sided numbness and  weakness. EXAM: MRI HEAD WITHOUT CONTRAST MRA HEAD WITHOUT CONTRAST MRA NECK WITHOUT CONTRAST TECHNIQUE: Multiplanar, multiecho pulse sequences of the brain and surrounding structures were obtained without intravenous contrast. Angiographic images of the Circle of Willis were obtained using MRA technique without intravenous contrast. Angiographic images of the neck were obtained using MRA technique without intravenous contrast. Carotid stenosis measurements (when applicable) are obtained utilizing NASCET criteria, using the distal internal carotid diameter as the denominator. COMPARISON:  Prior CT from 09/17/2020. FINDINGS: MRI HEAD FINDINGS Brain: Cerebral volume within normal limits for patient age. No focal parenchymal signal abnormality identified. No abnormal foci of restricted diffusion to suggest acute or subacute ischemia. Gray-white matter differentiation well maintained. No encephalomalacia to suggest chronic infarction. No foci of susceptibility artifact to suggest acute or chronic intracranial hemorrhage. No mass lesion, midline shift or mass effect. No hydrocephalus. No extra-axial fluid collection. Major dural sinuses are grossly patent. Pituitary gland and suprasellar region are normal. Midline structures intact and normal. Vascular: Major intracranial vascular flow voids well maintained and normal in appearance. Skull and upper cervical spine: Craniocervical junction normal. Visualized upper cervical spine within normal limits. Bone marrow signal intensity normal. No scalp soft tissue abnormality. Sinuses/Orbits: Globes and orbital soft tissues within normal limits. Paranasal sinuses are clear. No mastoid effusion. Inner ear structures normal. Other: None. MRA HEAD FINDINGS ANTERIOR CIRCULATION: Visualized distal cervical segments of the internal carotid arteries are widely patent with antegrade flow. Petrous, cavernous, and supraclinoid segments widely patent bilaterally. 4 mm focal outpouching  extending medially and slightly posteriorly from the cavernous right ICA consistent with an aneurysm (series 2, image 84). A1 segments widely patent. Normal anterior communicating artery complex. Anterior cerebral arteries patent to their distal aspects without stenosis. No M1 stenosis or occlusion. Normal MCA bifurcations. Distal MCA branches well perfused and symmetric. POSTERIOR CIRCULATION: Both V4 segments patent to the vertebrobasilar junction without stenosis. Right vertebral artery slightly dominant. Both PICA partially visualized and patent. Basilar patent to its distal aspect without stenosis. Superior cerebral arteries patent bilaterally. Both PCAs primarily supplied via the basilar well perfused or distal aspects. Small left posterior communicating artery noted. MRA NECK FINDINGS AORTIC ARCH: Examination somewhat technically limited by motion artifact and lack of IV contrast. Aortic arch and origin of the great vessels incompletely assessed on this exam. RIGHT CAROTID SYSTEM: Visualized right CCA widely patent to the bifurcation without stenosis. No significant atheromatous narrowing or irregularity about the right carotid bulb. Right ICA patent distally without stenosis, evidence for dissection, or occlusion. LEFT CAROTID SYSTEM:  Visualized left CCA patent to the bifurcation without stenosis. No significant atheromatous narrowing or irregularity about the left carotid bulb. Left ICA patent distally without stenosis, evidence for dissection, or occlusion. VERTEBRAL ARTERIES: Both vertebral arteries likely arise from the subclavian arteries. Right vertebral artery slightly dominant. Visualized vertebral arteries patent without stenosis, evidence for dissection or occlusion. IMPRESSION: MRI HEAD IMPRESSION: Normal brain MRI. No acute intracranial infarct or other abnormality. MRA HEAD IMPRESSION: 1. 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 2. Otherwise normal intracranial MRA. MRA NECK IMPRESSION:  Normal MRA of the neck. Electronically Signed   By: Rise MuBenjamin  McClintock M.D.   On: 09/18/2020 04:42   MR ANGIO NECK WO CONTRAST  Result Date: 09/18/2020 CLINICAL DATA:  Initial evaluation for acute stroke. Headache, left-sided numbness and weakness. EXAM: MRI HEAD WITHOUT CONTRAST MRA HEAD WITHOUT CONTRAST MRA NECK WITHOUT CONTRAST TECHNIQUE: Multiplanar, multiecho pulse sequences of the brain and surrounding structures were obtained without intravenous contrast. Angiographic images of the Circle of Willis were obtained using MRA technique without intravenous contrast. Angiographic images of the neck were obtained using MRA technique without intravenous contrast. Carotid stenosis measurements (when applicable) are obtained utilizing NASCET criteria, using the distal internal carotid diameter as the denominator. COMPARISON:  Prior CT from 09/17/2020. FINDINGS: MRI HEAD FINDINGS Brain: Cerebral volume within normal limits for patient age. No focal parenchymal signal abnormality identified. No abnormal foci of restricted diffusion to suggest acute or subacute ischemia. Gray-white matter differentiation well maintained. No encephalomalacia to suggest chronic infarction. No foci of susceptibility artifact to suggest acute or chronic intracranial hemorrhage. No mass lesion, midline shift or mass effect. No hydrocephalus. No extra-axial fluid collection. Major dural sinuses are grossly patent. Pituitary gland and suprasellar region are normal. Midline structures intact and normal. Vascular: Major intracranial vascular flow voids well maintained and normal in appearance. Skull and upper cervical spine: Craniocervical junction normal. Visualized upper cervical spine within normal limits. Bone marrow signal intensity normal. No scalp soft tissue abnormality. Sinuses/Orbits: Globes and orbital soft tissues within normal limits. Paranasal sinuses are clear. No mastoid effusion. Inner ear structures normal. Other: None. MRA  HEAD FINDINGS ANTERIOR CIRCULATION: Visualized distal cervical segments of the internal carotid arteries are widely patent with antegrade flow. Petrous, cavernous, and supraclinoid segments widely patent bilaterally. 4 mm focal outpouching extending medially and slightly posteriorly from the cavernous right ICA consistent with an aneurysm (series 2, image 84). A1 segments widely patent. Normal anterior communicating artery complex. Anterior cerebral arteries patent to their distal aspects without stenosis. No M1 stenosis or occlusion. Normal MCA bifurcations. Distal MCA branches well perfused and symmetric. POSTERIOR CIRCULATION: Both V4 segments patent to the vertebrobasilar junction without stenosis. Right vertebral artery slightly dominant. Both PICA partially visualized and patent. Basilar patent to its distal aspect without stenosis. Superior cerebral arteries patent bilaterally. Both PCAs primarily supplied via the basilar well perfused or distal aspects. Small left posterior communicating artery noted. MRA NECK FINDINGS AORTIC ARCH: Examination somewhat technically limited by motion artifact and lack of IV contrast. Aortic arch and origin of the great vessels incompletely assessed on this exam. RIGHT CAROTID SYSTEM: Visualized right CCA widely patent to the bifurcation without stenosis. No significant atheromatous narrowing or irregularity about the right carotid bulb. Right ICA patent distally without stenosis, evidence for dissection, or occlusion. LEFT CAROTID SYSTEM: Visualized left CCA patent to the bifurcation without stenosis. No significant atheromatous narrowing or irregularity about the left carotid bulb. Left ICA patent distally without stenosis, evidence for dissection, or  occlusion. VERTEBRAL ARTERIES: Both vertebral arteries likely arise from the subclavian arteries. Right vertebral artery slightly dominant. Visualized vertebral arteries patent without stenosis, evidence for dissection or  occlusion. IMPRESSION: MRI HEAD IMPRESSION: Normal brain MRI. No acute intracranial infarct or other abnormality. MRA HEAD IMPRESSION: 1. 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 2. Otherwise normal intracranial MRA. MRA NECK IMPRESSION: Normal MRA of the neck. Electronically Signed   By: Rise Mu M.D.   On: 09/18/2020 04:42   MR BRAIN WO CONTRAST  Result Date: 09/18/2020 CLINICAL DATA:  Initial evaluation for acute stroke. Headache, left-sided numbness and weakness. EXAM: MRI HEAD WITHOUT CONTRAST MRA HEAD WITHOUT CONTRAST MRA NECK WITHOUT CONTRAST TECHNIQUE: Multiplanar, multiecho pulse sequences of the brain and surrounding structures were obtained without intravenous contrast. Angiographic images of the Circle of Willis were obtained using MRA technique without intravenous contrast. Angiographic images of the neck were obtained using MRA technique without intravenous contrast. Carotid stenosis measurements (when applicable) are obtained utilizing NASCET criteria, using the distal internal carotid diameter as the denominator. COMPARISON:  Prior CT from 09/17/2020. FINDINGS: MRI HEAD FINDINGS Brain: Cerebral volume within normal limits for patient age. No focal parenchymal signal abnormality identified. No abnormal foci of restricted diffusion to suggest acute or subacute ischemia. Gray-white matter differentiation well maintained. No encephalomalacia to suggest chronic infarction. No foci of susceptibility artifact to suggest acute or chronic intracranial hemorrhage. No mass lesion, midline shift or mass effect. No hydrocephalus. No extra-axial fluid collection. Major dural sinuses are grossly patent. Pituitary gland and suprasellar region are normal. Midline structures intact and normal. Vascular: Major intracranial vascular flow voids well maintained and normal in appearance. Skull and upper cervical spine: Craniocervical junction normal. Visualized upper cervical spine within normal limits.  Bone marrow signal intensity normal. No scalp soft tissue abnormality. Sinuses/Orbits: Globes and orbital soft tissues within normal limits. Paranasal sinuses are clear. No mastoid effusion. Inner ear structures normal. Other: None. MRA HEAD FINDINGS ANTERIOR CIRCULATION: Visualized distal cervical segments of the internal carotid arteries are widely patent with antegrade flow. Petrous, cavernous, and supraclinoid segments widely patent bilaterally. 4 mm focal outpouching extending medially and slightly posteriorly from the cavernous right ICA consistent with an aneurysm (series 2, image 84). A1 segments widely patent. Normal anterior communicating artery complex. Anterior cerebral arteries patent to their distal aspects without stenosis. No M1 stenosis or occlusion. Normal MCA bifurcations. Distal MCA branches well perfused and symmetric. POSTERIOR CIRCULATION: Both V4 segments patent to the vertebrobasilar junction without stenosis. Right vertebral artery slightly dominant. Both PICA partially visualized and patent. Basilar patent to its distal aspect without stenosis. Superior cerebral arteries patent bilaterally. Both PCAs primarily supplied via the basilar well perfused or distal aspects. Small left posterior communicating artery noted. MRA NECK FINDINGS AORTIC ARCH: Examination somewhat technically limited by motion artifact and lack of IV contrast. Aortic arch and origin of the great vessels incompletely assessed on this exam. RIGHT CAROTID SYSTEM: Visualized right CCA widely patent to the bifurcation without stenosis. No significant atheromatous narrowing or irregularity about the right carotid bulb. Right ICA patent distally without stenosis, evidence for dissection, or occlusion. LEFT CAROTID SYSTEM: Visualized left CCA patent to the bifurcation without stenosis. No significant atheromatous narrowing or irregularity about the left carotid bulb. Left ICA patent distally without stenosis, evidence for  dissection, or occlusion. VERTEBRAL ARTERIES: Both vertebral arteries likely arise from the subclavian arteries. Right vertebral artery slightly dominant. Visualized vertebral arteries patent without stenosis, evidence for dissection or occlusion. IMPRESSION: MRI  HEAD IMPRESSION: Normal brain MRI. No acute intracranial infarct or other abnormality. MRA HEAD IMPRESSION: 1. 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 2. Otherwise normal intracranial MRA. MRA NECK IMPRESSION: Normal MRA of the neck. Electronically Signed   By: Rise Mu M.D.   On: 09/18/2020 04:42   ECHOCARDIOGRAM COMPLETE  Result Date: 09/18/2020    ECHOCARDIOGRAM REPORT   Patient Name:   AHLIVIA SALAHUDDIN Date of Exam: 09/18/2020 Medical Rec #:  [id number]     Height:       69.0 in Accession #:    [id number]    Weight:       185.4 lb Date of Birth:  Jul 24, 1995     BSA:          2.001 m Patient Age:    25 years      BP:           113/80 mmHg Patient Gender: F             HR:           73 bpm. Exam Location:  Inpatient Procedure: 2D Echo Indications:    stroke  History:        Patient has no prior history of Echocardiogram examinations.                 Risk Factors:Current Smoker.  Sonographer:    Delcie Roch Referring Phys: [id number] COURTNEY S HEARD IMPRESSIONS  1. Left ventricular ejection fraction, by estimation, is 60 to 65%. The left ventricle has normal function. The left ventricle has no regional wall motion abnormalities. Left ventricular diastolic parameters were normal.  2. Right ventricular systolic function is normal. The right ventricular size is normal.  3. The mitral valve is normal in structure. No evidence of mitral valve regurgitation. No evidence of mitral stenosis.  4. The aortic valve is normal in structure. Aortic valve regurgitation is not visualized. No aortic stenosis is present.  5. The inferior vena cava is normal in size with greater than 50% respiratory variability, suggesting right atrial pressure of 3 mmHg.  Conclusion(s)/Recommendation(s): No intracardiac source of embolism detected on this transthoracic study. A transesophageal echocardiogram is recommended to exclude cardiac source of embolism if clinically indicated. FINDINGS  Left Ventricle: Left ventricular ejection fraction, by estimation, is 60 to 65%. The left ventricle has normal function. The left ventricle has no regional wall motion abnormalities. The left ventricular internal cavity size was normal in size. There is  no left ventricular hypertrophy. Left ventricular diastolic parameters were normal. Right Ventricle: The right ventricular size is normal. No increase in right ventricular wall thickness. Right ventricular systolic function is normal. Left Atrium: Left atrial size was normal in size. Right Atrium: Right atrial size was normal in size. Pericardium: There is no evidence of pericardial effusion. Mitral Valve: The mitral valve is normal in structure. No evidence of mitral valve regurgitation. No evidence of mitral valve stenosis. Tricuspid Valve: The tricuspid valve is normal in structure. Tricuspid valve regurgitation is trivial. No evidence of tricuspid stenosis. Aortic Valve: The aortic valve is normal in structure. Aortic valve regurgitation is not visualized. No aortic stenosis is present. Pulmonic Valve: The pulmonic valve was normal in structure. Pulmonic valve regurgitation is not visualized. No evidence of pulmonic stenosis. Aorta: The aortic root is normal in size and structure. Venous: The inferior vena cava is normal in size with greater than 50% respiratory variability, suggesting right atrial pressure of 3 mmHg. IAS/Shunts: No atrial level shunt  detected by color flow Doppler.  LEFT VENTRICLE PLAX 2D LVIDd:         4.50 cm  Diastology LVIDs:         2.80 cm  LV e' medial:    8.16 cm/s LV PW:         0.90 cm  LV E/e' medial:  9.8 LV IVS:        1.00 cm  LV e' lateral:   14.40 cm/s LVOT diam:     1.70 cm  LV E/e' lateral: 5.6 LV SV:          40 LV SV Index:   20 LVOT Area:     2.27 cm  RIGHT VENTRICLE             IVC RV S prime:     12.40 cm/s  IVC diam: 1.20 cm TAPSE (M-mode): 2.0 cm LEFT ATRIUM             Index       RIGHT ATRIUM           Index LA diam:        3.10 cm 1.55 cm/m  RA Area:     10.50 cm LA Vol (A2C):   35.4 ml 17.70 ml/m RA Volume:   22.90 ml  11.45 ml/m LA Vol (A4C):   44.2 ml 22.09 ml/m LA Biplane Vol: 42.6 ml 21.29 ml/m  AORTIC VALVE LVOT Vmax:   92.10 cm/s LVOT Vmean:  62.600 cm/s LVOT VTI:    0.176 m  AORTA Ao Root diam: 2.80 cm Ao Asc diam:  2.80 cm MITRAL VALVE MV Area (PHT): 3.85 cm    SHUNTS MV Decel Time: 197 msec    Systemic VTI:  0.18 m MV E velocity: 80.10 cm/s  Systemic Diam: 1.70 cm MV A velocity: 70.70 cm/s MV E/A ratio:  1.13 Donato Schultz MD Electronically signed by Donato Schultz MD Signature Date/Time: 09/18/2020/12:08:17 PM    Final    CT HEAD CODE STROKE WO CONTRAST  Addendum Date: 09/17/2020   ADDENDUM REPORT: 09/17/2020 04:17 ADDENDUM: Study discussed by telephone with Dr. Read Drivers on 09/17/2020 at 0400 hours. Electronically Signed   By: Odessa Fleming M.D.   On: 09/17/2020 04:17   Result Date: 09/17/2020 CLINICAL DATA:  Code stroke. 25 year old female with left eye blurred vision, left lip numbness. EXAM: CT HEAD WITHOUT CONTRAST TECHNIQUE: Contiguous axial images were obtained from the base of the skull through the vertex without intravenous contrast. COMPARISON:  None. FINDINGS: Brain: Normal cerebral volume. No midline shift, ventriculomegaly, mass effect, evidence of mass lesion, intracranial hemorrhage or evidence of cortically based acute infarction. Gray-white matter differentiation is within normal limits throughout the brain. Vascular: No suspicious intracranial vascular hyperdensity. Skull: Negative. Sinuses/Orbits: Visualized paranasal sinuses and mastoids are clear. Other: Visualized orbit soft tissues are within normal limits. Visualized scalp soft tissues are within normal limits. ASPECTS  [facility] Stroke Program Early CT Score) Total score (0-10 with 10 being normal): 10 IMPRESSION: Normal noncontrast Head CT.  ASPECTS 10. Electronically Signed: By: Odessa Fleming M.D. On: 09/17/2020 03:59      HISTORY OF PRESENT ILLNESS Brittany Harrison is a 25 y.o. female with a past medical history of anxiety and possibly depression untreated, chronic headaches, presented to the freestanding ER for complaints of sudden onset of left-sided weakness and numbness.   She was seen by telemedicine neurology and given IV tPA due to left-sided symptoms. She reported that she was been having a headache  for 3 days but it was at around 1 AM when she was laying in bed and started to feel numb around her left side of the face on the forehead, cheeks and the lips.  She also started feeling some numbness on the left arm and leg.  She has not had her headaches with any strokelike symptoms in the past.   Extreme recent personal and professional stressors is what she reports on review of systems.   HOSPITAL COURSE Stroke like episode s/p IV  tPA treatment likely episode of complicated migraine Code Stroke CT head: No acute abnormality. ASPECTS 10.    MRI  Normal brain MRI. No acute intracranial infarct MRA neck: normal MRA head: 4 mm cavernous right ICA aneurysm  2D Echo ejection fraction 60-65%.  No cardiac source of embolism LDL 102 HgbA1c 5.3 VTE prophylaxis - Lovenox  daily Diet: heart healthy No antithrombotic prior to admission, now on aspirin 81 mg daily Therapy recommendations:  PT no needs, OT intermittent supervision (mom will provide this service) Disposition:  Likely home today   Complicated migraines 3 day history of headaches, has intermittent headaches at baseline but has been increasingly worse due to stress factors at home.  Takes Excedrin prn. Imitrex  x1 given, if headache improves will send patient home with prescription for Imitrex Follow-up with Neurology at outpatient clinic in 1  month   Hypertension Home meds:  none Stable Permissive hypertension (OK if < 220/120) but gradually normalize in 5-7 days Long-term BP goal normotensive   Hyperlipidemia Home meds:  none LDL 102, goal < 70 Add atorvastatin  daily  Continue statin at discharge     Other Stroke Risk Factors Cigarette smoker, smokes an average of 0.5 packs per dayadvised to stop smoking   Other Active Problems Incidental finding: 4 mm cavernous right ICA aneurysm on aneurysm. Outpatient follow-up with PCP  RN Pressure Injury Documentation:     DISCHARGE EXAM Blood pressure 109/73, pulse 80, temperature (!) 97.5 F (36.4 C), temperature source Oral, resp. rate 18, height  (1.753 m), weight 84.1 kg, last menstrual period 09/15/2020, SpO2 95 %, unknown if currently breastfeeding. Pleasant young Caucasian lady not in distress. Afebrile. Head is nontraumatic. Neck is supple without bruit.    Cardiac exam no murmur or gallop. Lungs are clear to auscultation. Distal pulses are well felt. Neurological Exam: Awake  Alert oriented x 3. Normal speech and language.eye movements full without nystagmus.fundi were not visualized. Vision acuity and fields appear normal. Hearing is normal. Palatal movements are normal. Face symmetric. Tongue midline. Normal strength, tone, reflexes and coordination. Normal sensation. Gait deferred.  Discharge Diet       Diet   Diet Heart Room service appropriate? Yes; Fluid consistency: Thin   liquids  DISCHARGE PLAN Disposition:  home with intermittent supervision aspirin 81 mg daily for secondary stroke prevention. Ongoing stroke risk factor control by Primary Care Physician at time of discharge Follow-up PCP in 2 weeks.  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   35 minutes were spent preparing discharge.  Lissy Olivencia-Simmons, ACNP-BC Stroke NP   I have personally obtained history,examined this patient, reviewed notes, independently viewed imaging studies, participated in medical decision making and plan of care.ROS completed by me personally and pertinent positives fully documented  I have  made any additions or clarifications directly to the above note. Agree with note above.    Delia Heady, MD Medical Director [facility] Pager: [phone] 09/18/2020 4:14 PM

## 2020-09-18 NOTE — Progress Notes (Signed)
Silvio Clayman  D/C'd Home per MD order.  Discussed with the patient and all questions fully answered.  VSS, Skin clean, dry and intact without evidence of skin break down, no evidence of skin tears noted. IV catheter discontinued intact. Site without signs and symptoms of complications. Dressing and pressure applied.  An After Visit Summary was printed and given to the patient. Patient received prescription.  D/c education completed with patient including follow up instructions, medication list, d/c activities limitations if indicated, with other d/c instructions as indicated by MD - patient able to verbalize understanding, all questions fully answered.   Patient instructed to return to ED, call 911, or call MD for any changes in condition.   Patient escorted via WC, and D/C home via private auto.  Melvenia Needles 09/18/2020 4:51 PM

## 2020-09-18 NOTE — Progress Notes (Signed)
  Echocardiogram 2D Echocardiogram has been performed.  Delcie Roch 09/18/2020, 10:51 AM

## 2020-09-21 NOTE — Progress Notes (Signed)
Late entry for Modified Rankin Scoring.  Scoring based on review of medical record.     09/18/20 1300  Modified Rankin (Stroke Patients Only)  Pre-Morbid Rankin Score 0  Modified Rankin 0  Brittany Harrison, [place]   [facility]  Pager [phone] Office [phone] 09/21/2020

## 2020-11-02 ENCOUNTER — Encounter: Payer: Self-pay | Admitting: Adult Health

## 2020-11-02 ENCOUNTER — Ambulatory Visit: Payer: Self-pay | Admitting: Adult Health

## 2020-11-02 VITALS — BP 112/79 | HR 101 | Ht 69.0 in | Wt 186.0 lb

## 2020-11-02 DIAGNOSIS — R2990 Unspecified symptoms and signs involving the nervous system: Secondary | ICD-10-CM

## 2020-11-02 DIAGNOSIS — I671 Cerebral aneurysm, nonruptured: Secondary | ICD-10-CM

## 2020-11-02 DIAGNOSIS — G43109 Migraine with aura, not intractable, without status migrainosus: Secondary | ICD-10-CM

## 2020-11-02 MED ORDER — TROKENDI XR 50 MG PO CP24: 50.0000 mg | ORAL_CAPSULE | Freq: Every evening | ORAL | 0 refills | Status: AC

## 2020-11-02 NOTE — Patient Instructions (Addendum)
Recommend use of Trokendi XR 25mg  nightly for 1 week then increase to 50mg  nightly there after. Please let me know after 1 week if you continue to experience headaches and we can increase dose further  Your migraine headaches should be able to get under good control where you will not need FMLA. We will hold off on completing FMLA at this time.   Restart aspirin 81 mg daily for secondary stroke prevention  Continue to follow up with PCP regarding cholesterol management  Maintain strict control of cholesterol with LDL cholesterol (bad cholesterol) goal below 70 mg/dL.   Other ways to help with headaches: Cool Compress. Lie down and place a cool compress on your head.   Avoid headache triggers. If certain foods or odors seem to have triggered your migraines in the past, avoid them. A headache diary might help you identify triggers.   Include physical activity in your daily routine.  Manage stress. Find healthy ways to cope with the stressors, such as delegating tasks on your to-do list.   Practice relaxation techniques. Try deep breathing, yoga, massage and visualization.   Eat regularly. Eating regularly scheduled meals and maintaining a healthy diet might help prevent headaches. Also, drink plenty of fluids.   Follow a regular sleep schedule. Sleep deprivation might contribute to headaches Consider biofeedback. With this mind-body technique, you learn to control certain bodily functions -- such as muscle tension, heart rate and blood pressure -- to prevent headaches or reduce headache pain.     Followup in the future with me in 4 months or call earlier if needed      Thank you for coming to see at [facility]. I hope we have been able to provide you high quality care today.  You may receive a patient satisfaction survey over the next few weeks. We would appreciate your feedback and comments so that we may continue to improve ourselves and the health of our  patients.   Chronic Migraine Headache A migraine headache is throbbing pain that is usually on one side of the head. Migraines that keep coming back are called recurring migraines. A migraine is called a chronic migraine if it happens at least 15 days in a month for morethan 3 months. Talk with your doctor about what things may bring on (trigger) your migraines. What are the causes? The exact cause of this condition is not known. A migraine may be caused when nerves in the brain become irritated and release chemicals that cause irritation and swelling (inflammation) of blood vessels. The irritation and swelling of the blood vessels causespain. Migraines may be brought on or caused by: Smoking. Foods and drinks, such as: Cheese. Chocolate. Alcohol. Caffeine. Certain substances in some foods or drinks. Some medicines. Other things that may bring on a migraine include: Periods, for women. Stress. Not enough sleep or too much sleep. Feeling very tired. Bright lights or loud noises. Smells Weather changes and being at high altitude. What increases the risk? The following factors may make you more likely to have chronic migraine: Having migraines or family members who have them. Being very sad (depressed) or feeling worried or nervous (anxious). Taking a lot of pain medicine. Having problems sleeping. Having heart disease, diabetes, or being very overweight (obese). What are the signs or symptoms? Symptoms of this condition include: Pain that feels like it throbs. Pain that is usually only on one side of the head. In some cases, the pain may be on both sides of  the head or around the head or neck. Very bad pain that keeps you from doing daily activities. Pain that gets worse with activity. Feeling like you may vomit (feeling nauseous) or vomiting. Pain when you are around bright lights, loud noises, or activity. Being sensitive to bright lights, loud noises, or smells. Feeling  dizzy. How is this treated? This condition is treated with: Medicines. These help to: Lessen pain and the feeling like you may vomit. Prevent migraines. Changes to your diet or sleep. Therapy. This might include: Relaxation training. Biofeedback. This is a treatment that teaches you to relax, use your brain to lower your heart rate, and control your breathing. Cognitive behavioral therapy (CBT). This therapy helps you set goals and follow up on the changes that you make. Acupuncture. Using a device that provides electrical stimulation to your nerves, which can help take away pain. Surgery, if the other treatments do not work. Follow these instructions at home: Medicines Take over-the-counter and prescription medicines only as told by your doctor. Ask your doctor if the medicine prescribed to you requires you to avoid driving or using machinery. Lifestyle  Do not use any products that contain nicotine or tobacco, such as cigarettes, e-cigarettes, and chewing tobacco. If you need help quitting, ask your doctor. Do not drink alcohol. Get 7-9 hours of sleep each night. Lower the stress in your life. Ask your doctor about ways to do this. Stay at a healthy weight. Talk with your doctor if you need help losing weight. Get regular exercise.  General instructions  Keep a journal to find out if certain things bring on migraines. For example, write down: What you eat and drink. How much sleep you get. Any change to your diet or medicines. Lie down in a dark, quiet room when you have a migraine. Try placing a cool towel over your head when you have a migraine. Keep lights dim if bright lights bother you or make your migraines worse. Keep all follow-up visits as told by your doctor. This is important.  Where to find more information Coalition for Headache and Migraine Patients (CHAMP): headachemigraine.org American Migraine Foundation: americanmigrainefoundation.org National Headache  Foundation: headaches.org Contact a doctor if: Medicine does not help your migraine. Your pain keeps coming back. Get help right away if: Your migraine becomes really bad and medicine does not help. You have a stiff neck and fever. You have trouble seeing. Your muscles are weak or you lose control of them. You lose your balance or have trouble walking. You feel like you will faint or you faint. You start having sudden, very bad headaches. You have a seizure. Summary A migraine headache is very bad, throbbing pain that is usually on one side of the head. A chronic migraine is a migraine that happens 15 days in a month for more than 3 months. Talk with your doctor about what things may bring on your migraines. Lie down in a dark, quiet room when you have a migraine. Keep a journal. This can help you find out if certain things make you have migraines. This information is not intended to replace advice given to you by your health care provider. Make sure you discuss any questions you have with your healthcare provider. Document Revised: 04/09/2019 Document Reviewed: 04/09/2019 Elsevier Patient Education  2022 [employer].

## 2020-11-02 NOTE — Progress Notes (Signed)
[facility] [address] [place]. [place] [address] [phone]       HOSPITAL FOLLOW UP NOTE  [name] Date of Birth:  01/18/1996 Medical Record Number:  [id number]   Reason for Referral:  hospital stroke follow up    SUBJECTIVE:   CHIEF COMPLAINT:  Chief Complaint  Patient presents with   Follow-up    Rm  3 with daughter  Pt is well, has been having sever headaches daily but overall stable.      HPI:   Brittany Harrison is a 25 y.o. female with a past medical history of anxiety and possibly depression untreated, and chronic headaches who presented on 09/17/2020 to the freestanding ER for complaints of sudden onset of left-sided weakness and numbness with HA present over the past 3 days.   She was seen by telemedicine neurology and given IV tPA due to left-sided symptoms.  Personally reviewed hospitalization pertinent progress notes, lab work and imaging. Eval by Dr. Pearlean Brownie for stroke like episode s/p IV tPA likely episode of complicated migraine.  MRI unremarkable.  MRA neck unremarkable.  MRA head 34mm cavernous right ICA aneurysm.  EF 60 to 65%.  LDL 102.  A1c 5.3.  Recommended aspirin 81 mg daily and atorvastatin 40 mg daily for stroke prevention.  Current tobacco use.  Initiated Imitrex for migraine prophylaxis.  PT/OT no therapy needs.  Today, 11/02/2020, Brittany Harrison is being seen for hospital follow-up accompanied by her daughter. Reports daily headaches generalized location (not more severe in one area or one side), pulsating sensation, with photophobia, phonophobia and nausea.  Previously treated for migraines as a child which eventually subsided but returned after the birth of her 6 years ago.  Initially experiencing one every couple of months, but over the past year experiencing 2-3/month and over the past 1-2 months has been more persistent.  She has been using Trokendi XR 25 mg daily as needed for severe headache with some benefit.  She is also on  phentermine for weight loss (recently just restarted 8/18). She stopped all other medication due to possible allergic reaction with hives and slowly adding medications back. Denies use of OTC pain relievers as no benefit. Continued tobacco use 3-4/day (previuosly 1 PPD). Requesting FMLA to be filled out for appointments and headache days. 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  No further concerns at this time.     PERTINENT IMAGING  MR BRAIN MRA HEAD/NECK 09/18/2020  IMPRESSION: MRI HEAD IMPRESSION: Normal brain MRI. No acute intracranial infarct or other abnormality.   MRA HEAD IMPRESSION: 1. 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 2. Otherwise normal intracranial MRA.   MRA NECK IMPRESSION: Normal MRA of the neck.      ROS:   14 system review of systems performed and negative with exception of those listed in HPI  PMH:  Past Medical History:  Diagnosis Date   Kidney stone    Ovarian cyst    Raynaud disease     PSH: History reviewed. No pertinent surgical history.  Social History:  Social History   Socioeconomic History   Marital status: Single    Spouse name: Not on file   Number of children: Not on file   Years of education: Not on file   Highest education level: Not on file  Occupational History   Not on file  Tobacco Use  Smoking status: Every Day    Packs/day: 0.50    Types: Cigarettes   Smokeless tobacco: Never  Substance and Sexual Activity   Alcohol use: No   Drug use: No   Sexual activity: Not on file  Other Topics Concern   Not on file  Social History Narrative   Not on file   Social Determinants of Health   Financial Resource Strain: Not on file  Food Insecurity: Not on file  Transportation Needs: Not on file  Physical Activity: Not on file  Stress: Not on file  Social Connections: Not on file   Intimate Partner Violence: Not on file    Family History: History reviewed. No pertinent family history.  Medications:   Current Outpatient Medications on File Prior to Visit  Medication Sig Dispense Refill   phentermine 37.5 MG capsule Take 37.5 mg by mouth every morning.     sulfamethoxazole-trimethoprim (BACTRIM DS) 800-160 MG tablet Take 1 tablet by mouth once.     atorvastatin (LIPITOR) 40 MG tablet Take 1 tablet (40 mg total) by mouth daily. (Patient not taking: Reported on 11/02/2020) 30 tablet 2   SUMAtriptan (IMITREX) 100 MG tablet Take 1 tablet (100 mg total) by mouth once as needed for up to 16 doses for migraine. May repeat in 2 hours if headache persists or recurs.  Do not take more than 2 per day and no more than 2 days per week. (Patient not taking: Reported on 11/02/2020) 16 tablet 0   No current facility-administered medications on file prior to visit.    Allergies:   Allergies  Allergen Reactions   Codeine Other (See Comments)    Unknown - childhood reaction per pt; Patient reports getting sick/nauseous     Sulfa Antibiotics Other (See Comments)    Unknown - childhood reaction per pt      OBJECTIVE:  Physical Exam  Vitals:   11/02/20 1507  BP: 112/79  Pulse: (!) 101  Weight: 186 lb (84.4 kg)  Height: 5\' 9"  (1.753 m)   Body mass index is 27.47 kg/m. No results found.  General: well developed, well nourished, pleasant young Caucasian female, seated, in no evident distress Head: head normocephalic and atraumatic.   Neck: supple with no carotid or supraclavicular bruits Cardiovascular: regular rate and rhythm, no murmurs Musculoskeletal: no deformity Skin:  no rash/petichiae Vascular:  Normal pulses all extremities   Neurologic Exam Mental Status: Awake and fully alert.  Fluent speech and language.  Oriented to place and time. Recent and remote memory intact. Attention span, concentration and fund of knowledge appropriate. Mood and affect appropriate.   Cranial Nerves: Fundoscopic exam reveals sharp disc margins. Pupils equal, briskly reactive to light. Extraocular movements full without nystagmus. Visual fields full to confrontation. Hearing intact. Facial sensation intact. Face, tongue, palate moves normally and symmetrically.  Motor: Normal bulk and tone. Normal strength in all tested extremity muscles Sensory.: intact to touch , pinprick , position and vibratory sensation.  Coordination: Rapid alternating movements normal in all extremities. Finger-to-nose and heel-to-shin performed accurately bilaterally. Gait and Station: Arises from chair without difficulty. Stance is normal. Gait demonstrates normal stride length and balance without use of assistive device. Tandem walk and heel toe without difficulty.  Reflexes: 1+ and symmetric. Toes downgoing.     NIHSS  0 Modified Rankin  0      ASSESSMENT: Brittany Harrison is a 25 y.o. year old female with recent strokelike episode s/p tPA possibly episode of complicated migraine on 09/17/2020 after  presenting with left-sided numbness/tingling and headache over the past 3 days. Vascular risk factors include complicated migraine, tobacco use and right ICA aneurysm.      PLAN:  Strokelike episode:  Unable to completed rule out TIA or aborted stroke therefore recommend restarting aspirin 81 mg daily  for secondary stroke prevention. Okay to hold off on statin at this time due to possible allergic reaction.   Discussed secondary stroke prevention measures and importance of close PCP follow up for aggressive stroke risk factor management. I have gone over the pathophysiology of stroke, warning signs and symptoms, risk factors and their management in some detail with instructions to go to the closest emergency room for symptoms of concern. Complicated migraine: Hx of chronic migraines:  Recommend continuing Trokendi XR 25mg  but take nightly (current use PRN) for 1 week then increase to 50mg  nightly.  She is currently awaiting medicaid - samples provided. Advised to call office after 2 weeks if no improvement.  Advised that we will not complete FMLA as this is the first visit and with use of medications, migraines should get under good control. If all options exhausted but continues to experience migraines, may consider FMLA at that time Cerebral aneurysm: Recommend CTA head around 03/2021 for surveillance monitoring    Follow up in 4 months or call earlier if needed   CC:  [facility] provider: Dr. PCP: 04/2021, Pearlean Brownie., MD    I spent 38 minutes of face-to-face and non-face-to-face time with patient.  This included previsit chart review including review of recent hospitalization, lab review, study review, order entry, electronic health record documentation, patient education regarding recent stroke like episode possibly complicated migraine including further treatment options and interventions, secondary stroke prevention measures and importance of managing stroke risk factors and answered all other questions to patient satisfaction  Bernette Mayers, AGNP-BC  [facility] [address] [place], [address] [place]  Phone [phone] Fax [phone] Note: This document was prepared with digital dictation and possible smart phrase technology. Any transcriptional errors that result from this process are unintentional.

## 2020-11-05 NOTE — Progress Notes (Signed)
I agree with the above plan 

## 2020-12-27 ENCOUNTER — Other Ambulatory Visit: Payer: Self-pay

## 2020-12-27 NOTE — Patient Outreach (Signed)
[facility]) Care Management  12/27/2020  Brittany Harrison Apr 22, 1995 [id number]   First telephone outreach attempt to obtain mRS. No answer. Not able to leave message for returned call.  [name] Management Assistant [phone]

## 2020-12-30 ENCOUNTER — Other Ambulatory Visit: Payer: Self-pay

## 2020-12-30 NOTE — Patient Outreach (Signed)
[facility]) Care Management  12/30/2020  Brittany Harrison 09-27-1995 [id number]   Second telephone outreach attempt to obtain mRS. No answer. Left message for returned call.  [name] Management Assistant [phone]

## 2020-12-31 ENCOUNTER — Other Ambulatory Visit: Payer: Self-pay

## 2020-12-31 NOTE — Patient Outreach (Signed)
[facility]) Care Management  12/31/2020  Brittany Harrison 18-Sep-1995 [id number]   3 outreach attempts were completed to obtain mRs. mRs could not be obtained because patient never returned my calls. mRs=7    [name] Care Management Assistant [phone]

## 2021-03-16 ENCOUNTER — Ambulatory Visit: Payer: Self-pay | Admitting: Adult Health

## 2021-03-16 ENCOUNTER — Encounter: Payer: Self-pay | Admitting: Adult Health

## 2021-03-16 NOTE — Progress Notes (Deleted)
[facility] [address] [place]. [place] [address] [phone]       HOSPITAL FOLLOW UP NOTE  Brittany. Brittany Harrison Date of Birth:  [birth year] Medical Record Number:  [id number]   Reason for Referral:  hospital stroke follow up    SUBJECTIVE:   CHIEF COMPLAINT:  No chief complaint on file.    HPI:   Update 03/16/2020: Brittany Harrison is here today for a stroke/complicated migraine follow-up accompanied by ***. Reports [stable/improving] migraines. Denies new stroke/TIA symptoms. Remains on Aspirin?, Lipitor, Topiramate, and Imitrex without side effects. BP today ***. Labs A1c 5.3, LDL 102 (09/18/2020).  Initial visit 11/02/2020,JM: Brittany Harrison is a 26 y.o. female with a past medical history of anxiety and possibly depression untreated, and chronic headaches who presented on 09/17/2020 to the freestanding ER for complaints of sudden onset of left-sided weakness and numbness with HA present over the past 3 days.   She was seen by telemedicine neurology and given IV tPA due to left-sided symptoms.  Personally reviewed hospitalization pertinent progress notes, lab work and imaging. Eval by Dr. Leonie Man for stroke like episode s/p IV tPA likely episode of complicated migraine.  MRI unremarkable.  MRA neck unremarkable.  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  EF 60 to 65%.  LDL 102.  A1c 5.3.  Recommended aspirin 81 mg daily and atorvastatin 40 mg daily for stroke prevention.  Current tobacco use.  Initiated Imitrex for migraine prophylaxis.  PT/OT no therapy needs.  Today, 11/02/2020, Brittany Harrison is being seen for hospital follow-up accompanied by her daughter. Reports daily headaches generalized location (not more severe in one area or one side), pulsating sensation, with photophobia, phonophobia and nausea.  Previously treated for migraines as a child which eventually subsided but returned after the birth of her 6 years ago.  Initially experiencing one every couple of months, but  over the past year experiencing 2-3/month and over the past 1-2 months has been more persistent.  She has been using Trokendi XR 25 mg daily as needed for severe headache with some benefit.  She is also on phentermine for weight loss (recently just restarted 8/18). She stopped all other medication due to possible allergic reaction with hives and slowly adding medications back. Denies use of OTC pain relievers as no benefit. Continued tobacco use 3-4/day (previuosly 1 PPD). Requesting FMLA to be filled out for appointments and headache days. 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  No further concerns at this time.     PERTINENT IMAGING  MR BRAIN MRA HEAD/NECK 09/18/2020  IMPRESSION: MRI HEAD IMPRESSION: Normal brain MRI. No acute intracranial infarct or other abnormality.   MRA HEAD IMPRESSION: 1. 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 2. Otherwise normal intracranial MRA.   MRA NECK IMPRESSION: Normal MRA of the neck.      ROS:   14 system review of systems performed and negative with exception of those listed in HPI  PMH:  Past Medical History:  Diagnosis Date   Kidney stone    Ovarian cyst    Raynaud disease     PSH: No past surgical history on file.  Social History:  Social History   Socioeconomic History   Marital status: Single    Spouse name: Not on file   Number of children: Not on file   Years of education: Not on file  Highest education level: Not on file  Occupational History   Not on file  Tobacco Use   Smoking status: Every Day    Packs/day: 0.50    Types: Cigarettes   Smokeless tobacco: Never  Substance and Sexual Activity   Alcohol use: No   Drug use: No   Sexual activity: Not on file  Other Topics Concern   Not on file  Social History Narrative   Not on file   Social Determinants of Health   Financial  Resource Strain: Not on file  Food Insecurity: Not on file  Transportation Needs: Not on file  Physical Activity: Not on file  Stress: Not on file  Social Connections: Not on file  Intimate Partner Violence: Not on file    Family History: No family history on file.  Medications:   Current Outpatient Medications on File Prior to Visit  Medication Sig Dispense Refill   atorvastatin (LIPITOR) 40 MG tablet Take 1 tablet (40 mg total) by mouth daily. (Patient not taking: Reported on 11/02/2020) 30 tablet 2   phentermine 37.5 MG capsule Take 37.5 mg by mouth every morning.     sulfamethoxazole-trimethoprim (BACTRIM DS) 800-160 MG tablet Take 1 tablet by mouth once.     SUMAtriptan (IMITREX) 100 MG tablet Take 1 tablet (100 mg total) by mouth once as needed for up to 16 doses for migraine. May repeat in 2 hours if headache persists or recurs.  Do not take more than 2 per day and no more than 2 days per week. (Patient not taking: Reported on 11/02/2020) 16 tablet 0   Topiramate ER (TROKENDI XR) 50 MG CP24 Take 50 mg by mouth at bedtime. 14 capsule 0   No current facility-administered medications on file prior to visit.    Allergies:   Allergies  Allergen Reactions   Codeine Other (See Comments)    Unknown - childhood reaction per pt; Patient reports getting sick/nauseous     Sulfa Antibiotics Other (See Comments)    Unknown - childhood reaction per pt      OBJECTIVE:  Physical Exam  There were no vitals filed for this visit.  There is no height or weight on file to calculate BMI. No results found.  General: well developed, well nourished, pleasant young Caucasian female, seated, in no evident distress Head: head normocephalic and atraumatic.   Neck: supple with no carotid or supraclavicular bruits Cardiovascular: regular rate and rhythm, no murmurs Musculoskeletal: no deformity Skin:  no rash/petichiae Vascular:  Normal pulses all extremities   Neurologic Exam Mental  Status: Awake and fully alert.  Fluent speech and language.  Oriented to place and time. Recent and remote memory intact. Attention span, concentration and fund of knowledge appropriate. Mood and affect appropriate.  Cranial Nerves: Fundoscopic exam reveals sharp disc margins. Pupils equal, briskly reactive to light. Extraocular movements full without nystagmus. Visual fields full to confrontation. Hearing intact. Facial sensation intact. Face, tongue, palate moves normally and symmetrically.  Motor: Normal bulk and tone. Normal strength in all tested extremity muscles Sensory.: intact to touch , pinprick , position and vibratory sensation.  Coordination: Rapid alternating movements normal in all extremities. Finger-to-nose and heel-to-shin performed accurately bilaterally. Gait and Station: Arises from chair without difficulty. Stance is normal. Gait demonstrates normal stride length and balance without use of assistive device. Tandem walk and heel toe without difficulty.  Reflexes: 1+ and symmetric. Toes downgoing.     NIHSS  0 Modified Rankin  0  ASSESSMENT: Brittany Harrison is a 26 y.o. year old female with recent strokelike episode s/p tPA possibly episode of complicated migraine on [id number] after presenting with left-sided numbness/tingling and headache over the past 3 days. Vascular risk factors include complicated migraine, tobacco use and right ICA aneurysm.      PLAN:  Strokelike episode:  Unable to completed rule out TIA or aborted stroke therefore recommend restarting aspirin 81 mg daily for secondary stroke prevention. Okay to hold off on statin at this time due to possible allergic reaction.   Discussed secondary stroke prevention measures and importance of close PCP follow up for aggressive stroke risk factor management. I have gone over the pathophysiology of stroke, warning signs and symptoms, risk factors and their management in some detail with instructions to go to the  closest emergency room for symptoms of concern. Complicated migraine: Hx of chronic migraines: Recommend continuing Trokendi XR 25mg  but take nightly (current use PRN) for 1 week then increase to 50mg  nightly. She is currently awaiting medicaid - samples provided. Advised to call office after 2 weeks if no improvement.  Advised that we will not complete FMLA as this is the first visit and with use of medications, migraines should get under good control. If all options exhausted but continues to experience migraines, may consider FMLA at that time Cerebral aneurysm: Recommend CTA head around 03/2021 for surveillance monitoring    Follow up in 4 months or call earlier if needed   CC:  [facility] provider: Dr. Leonie Man PCP: Jethro Bolus, Barton Fanny., MD    I spent 38 minutes of face-to-face and non-face-to-face time with patient.  This included previsit chart review including review of recent hospitalization, lab review, study review, order entry, electronic health record documentation, patient education regarding recent stroke like episode possibly complicated migraine including further treatment options and interventions, secondary stroke prevention measures and importance of managing stroke risk factors and answered all other questions to patient satisfaction  Frann Rider, AGNP-BC  [facility] [address] [place], [place] [address]  Phone [phone] Fax [phone] Note: This document was prepared with digital dictation and possible smart phrase technology. Any transcriptional errors that result from this process are unintentional.

## 2022-06-23 ENCOUNTER — Other Ambulatory Visit (HOSPITAL_BASED_OUTPATIENT_CLINIC_OR_DEPARTMENT_OTHER): Payer: Self-pay

## 2022-06-23 MED ORDER — ZEPBOUND 5 MG/0.5ML ~~LOC~~ SOAJ
5.0000 mg | SUBCUTANEOUS | 0 refills | Status: AC
  Filled 2022-06-23: qty 2, 28d supply, fill #0

## 2022-06-26 ENCOUNTER — Other Ambulatory Visit (HOSPITAL_BASED_OUTPATIENT_CLINIC_OR_DEPARTMENT_OTHER): Payer: Self-pay

## 2022-06-26 MED ORDER — FOLIC ACID 1 MG PO TABS
1.0000 mg | ORAL_TABLET | Freq: Every day | ORAL | 3 refills | Status: AC
  Filled 2022-06-26: qty 30, 30d supply, fill #0

## 2022-06-26 MED ORDER — CHOLECALCIFEROL 1.25 MG (50000 UT) PO CAPS
50000.0000 [IU] | ORAL_CAPSULE | ORAL | 3 refills | Status: AC
  Filled 2022-06-26: qty 13, 30d supply, fill #0

## 2022-06-27 ENCOUNTER — Other Ambulatory Visit (HOSPITAL_BASED_OUTPATIENT_CLINIC_OR_DEPARTMENT_OTHER): Payer: Self-pay

## 2022-07-06 ENCOUNTER — Other Ambulatory Visit (HOSPITAL_BASED_OUTPATIENT_CLINIC_OR_DEPARTMENT_OTHER): Payer: Self-pay

## 2022-08-31 ENCOUNTER — Encounter (HOSPITAL_BASED_OUTPATIENT_CLINIC_OR_DEPARTMENT_OTHER): Payer: Self-pay

## 2022-08-31 ENCOUNTER — Other Ambulatory Visit: Payer: Self-pay

## 2022-08-31 ENCOUNTER — Emergency Department (HOSPITAL_BASED_OUTPATIENT_CLINIC_OR_DEPARTMENT_OTHER)
Admission: EM | Admit: 2022-08-31 | Discharge: 2022-08-31 | Disposition: A | Discharge status: Home / Self Care | Payer: 59 | Attending: Emergency Medicine | Admitting: Emergency Medicine

## 2022-08-31 ENCOUNTER — Emergency Department (HOSPITAL_BASED_OUTPATIENT_CLINIC_OR_DEPARTMENT_OTHER): Payer: 59

## 2022-08-31 DIAGNOSIS — G43809 Other migraine, not intractable, without status migrainosus: Secondary | ICD-10-CM | POA: Insufficient documentation

## 2022-08-31 DIAGNOSIS — R519 Headache, unspecified: Secondary | ICD-10-CM | POA: Diagnosis present

## 2022-08-31 LAB — CBC
HCT: 38.9 % (ref 36.0–46.0)
Hemoglobin: 13.3 g/dL (ref 12.0–15.0)
MCH: 30.4 pg (ref 26.0–34.0)
MCHC: 34.2 g/dL (ref 30.0–36.0)
MCV: 89 fL (ref 80.0–100.0)
Platelets: 250 10*3/uL (ref 150–400)
RBC: 4.37 MIL/uL (ref 3.87–5.11)
RDW: 11.7 % (ref 11.5–15.5)
WBC: 7.6 10*3/uL (ref 4.0–10.5)
nRBC: 0 % (ref 0.0–0.2)

## 2022-08-31 LAB — BASIC METABOLIC PANEL
Anion gap: 7 (ref 5–15)
BUN: 9 mg/dL (ref 6–20)
CO2: 25 mmol/L (ref 22–32)
Calcium: 8.8 mg/dL — ABNORMAL LOW (ref 8.9–10.3)
Chloride: 106 mmol/L (ref 98–111)
Creatinine, Ser: 0.73 mg/dL (ref 0.44–1.00)
GFR, Estimated: >60 mL/min (ref >60)
Glucose, Bld: 80 mg/dL (ref 70–99)
Potassium: 3.9 mmol/L (ref 3.5–5.1)
Sodium: 138 mmol/L (ref 135–145)

## 2022-08-31 MED ORDER — METOCLOPRAMIDE HCL 5 MG/ML IJ SOLN
10.0000 mg | Freq: Once | INTRAMUSCULAR | Status: AC
  Administered 2022-08-31: 10 mg via INTRAVENOUS
  Filled 2022-08-31: qty 2

## 2022-08-31 MED ORDER — IOHEXOL 350 MG/ML SOLN
75.0000 mL | Freq: Once | INTRAVENOUS | Status: AC | PRN
  Administered 2022-08-31: 75 mL via INTRAVENOUS

## 2022-08-31 MED ORDER — KETOROLAC TROMETHAMINE 15 MG/ML IJ SOLN
15.0000 mg | Freq: Once | INTRAMUSCULAR | Status: AC
  Administered 2022-08-31: 15 mg via INTRAVENOUS
  Filled 2022-08-31: qty 1

## 2022-08-31 MED ORDER — SODIUM CHLORIDE 0.9 % IV BOLUS
1000.0000 mL | Freq: Once | INTRAVENOUS | Status: AC
  Administered 2022-08-31: 1000 mL via INTRAVENOUS

## 2022-08-31 MED ORDER — PROCHLORPERAZINE MALEATE 10 MG PO TABS: 10.0000 mg | ORAL_TABLET | Freq: Two times a day (BID) | ORAL | 0 refills | Status: AC | PRN

## 2022-08-31 MED ORDER — DIPHENHYDRAMINE HCL 50 MG/ML IJ SOLN
25.0000 mg | Freq: Once | INTRAMUSCULAR | Status: AC
  Administered 2022-08-31: 25 mg via INTRAVENOUS
  Filled 2022-08-31: qty 1

## 2022-08-31 NOTE — ED Notes (Signed)
Attempted 2 IV starts without success 

## 2022-08-31 NOTE — ED Provider Notes (Signed)
[name] Provider Note   CSN: [id number] Arrival date & time: 08/31/22  1445     History  Chief Complaint  Patient presents with   Headache    Brittany Harrison is a 27 y.o. female with a past history of migraine, ICA aneurysm presents today for evaluation of headache.  States that her last 2 weeks she has had progressively worsening headache. The pain is in the right side of her head, photophobia, and phonophobia.  Patient has a right ICA aneurysm, measured 4 mm in 2022.  He is concerned if the headaches caused by aneurysm.  She denies fever, chest pain, shortness of breath, vaginal chlamydia symptoms.  She endorses nausea without vomiting.  She denies any vision changes.   Headache     Past Medical History:  Diagnosis Date   Kidney stone    Ovarian cyst    Raynaud disease    History reviewed. No pertinent surgical history.   Home Medications Prior to Admission medications   Medication Sig Start Date End Date Taking? Authorizing Provider  atorvastatin (LIPITOR) 40 MG tablet Take 1 tablet (40 mg total) by mouth daily. Patient not taking: Reported on 11/02/2020 09/19/20 12/18/20  Garner Gavel, NP  Cholecalciferol 1.25 MG (50000 UT) capsule Take 1 capsule (50,000 Units total) by mouth 3 (three) times a week on Monday, Wednesday, and Friday. 06/26/22     folic acid (FOLVITE) 1 MG tablet Take 1 tablet (1 mg total) by mouth daily. 06/26/22     phentermine 37.5 MG capsule Take 37.5 mg by mouth every morning.    [provider]  sulfamethoxazole-trimethoprim (BACTRIM DS) 800-160 MG tablet Take 1 tablet by mouth once.    [provider]  SUMAtriptan (IMITREX) 100 MG tablet Take 1 tablet (100 mg total) by mouth once as needed for up to 16 doses for migraine. May repeat in 2 hours if headache persists or recurs.  Do not take more than 2 per day and no more than 2 days per week. Patient not taking: Reported on  11/02/2020 09/18/20   Olivencia-Simmons, Deforest Hoyles, NP  tirzepatide (ZEPBOUND) 5 MG/0.5ML Pen Inject 5 mg into the skin once a week. 06/23/22     Topiramate ER (TROKENDI XR) 50 MG CP24 Take 50 mg by mouth at bedtime. 11/02/20   Ihor Austin, NP      Allergies    Codeine, Sulfa antibiotics, and Topamax [topiramate]    Review of Systems   Review of Systems  Neurological:  Positive for headaches.    Physical Exam Updated Vital Signs BP (!) 121/94 (BP Location: Right Arm)   Pulse 100   Temp 98.3 F (36.8 C) (Oral)   Resp 17   Ht 5\' 9"  (1.753 m)   Wt 93.4 kg   LMP 08/29/2022   SpO2 96%   BMI 30.42 kg/m  Physical Exam Vitals and nursing note reviewed.  Constitutional:      Appearance: Normal appearance.  HENT:     Head: Normocephalic and atraumatic.     Mouth/Throat:     Mouth: Mucous membranes are moist.  Eyes:     General: No scleral icterus. Cardiovascular:     Rate and Rhythm: Normal rate and regular rhythm.     Pulses: Normal pulses.     Heart sounds: Normal heart sounds.  Pulmonary:     Effort: Pulmonary effort is normal.     Breath sounds: Normal breath sounds.  Abdominal:     General:  Abdomen is flat.     Palpations: Abdomen is soft.     Tenderness: There is no abdominal tenderness.  Musculoskeletal:        General: No deformity.  Skin:    General: Skin is warm.     Findings: No rash.  Neurological:     General: No focal deficit present.     Mental Status: She is alert.     Comments: Cranial nerves II through XII intact. Intact sensation to light touch in all 4 extremities. 5/5 strength in all 4 extremities. Intact finger-to-nose and heel-to-shin of all 4 extremities. No visual field cuts. No neglect noted. No aphasia noted.  Psychiatric:        Mood and Affect: Mood normal.     ED Results / Procedures / Treatments   Labs (all labs ordered are listed, but only abnormal results are displayed) Labs Reviewed - No data to  display  EKG None  Radiology No results found.  Procedures Procedures    Medications Ordered in ED Medications - No data to display  ED Course/ Medical Decision Making/ A&P                             Medical Decision Making Amount and/or Complexity of Data Reviewed Labs: ordered. Radiology: ordered.  Risk Prescription drug management.   This patient presents to the ED for headache, this involves an extensive number of treatment options, and is a complaint that carries with a high risk of complications and morbidity.  The differential diagnosis includes CVA/ICH, mass, tension headache, migraine headache, meningitis.  This is not an exhaustive list.  Lab tests: I ordered and personally interpreted labs.  The pertinent results include: WBC unremarkable. Hbg unremarkable. Platelets unremarkable. Electrolytes unremarkable. BUN, creatinine unremarkable.   Imaging studies: I ordered imaging studies. I personally reviewed, interpreted imaging and agree with the radiologist's interpretations. The results include: CT angio head showed a stable 4 millimeter aneurysm in the right ICA.  No other intracranial abnormalities..  Problem list/ ED course/ Critical interventions/ Medical management: HPI: See above Vital signs within normal range and stable throughout visit. Laboratory/imaging studies significant for: See above. On physical examination, patient is afebrile and appears in no acute distress.  There is no neurological deficit on my examination.  Symptoms are most consistent with benign headache from either tension type headache vs migraine.  No headache red flags.  Neuro exam without evidence of focal neurological findings so doubt meningitis, encephalitis, stroke.  No history of head trauma, unlikely fracture, or ICH.  Based on CT angio head, I have low suspicion for any large vessel occlusion, unlikely mass effect in the brain from tumor or abscess.  CT angio head showed a stable  4 mm aneurysm in the right ICA.  No other intracranial abnormalities.  Pain was controlled with migraine cocktail and patient was discharged home with PCP and neurology follow-up.  I sent an Rx of compazine. Strict return precaution discussed. I have reviewed the patient home medicines and have made adjustments as needed.  Cardiac monitoring/EKG: The patient was maintained on a cardiac monitor.  I personally reviewed and interpreted the cardiac monitor which showed an underlying rhythm of: sinus rhythm.  Additional history obtained: External records from outside source obtained and reviewed including: Chart review including previous notes, labs, imaging.  Consultations obtained:  Disposition Continued outpatient therapy. Follow-up with PCP recommended for reevaluation of symptoms. Treatment plan discussed with patient.  Pt acknowledged understanding was agreeable to the plan. Worrisome signs and symptoms were discussed with patient, and patient acknowledged understanding to return to the ED if they noticed these signs and symptoms. Patient was stable upon discharge.   This chart was dictated using voice recognition software.  Despite best efforts to proofread,  errors can occur which can change the documentation meaning.          Final Clinical Impression(s) / ED Diagnoses Final diagnoses:  Other migraine without status migrainosus, not intractable    Rx / DC Orders ED Discharge Orders          Ordered    prochlorperazine (COMPAZINE) 10 MG tablet  2 times daily PRN        08/31/22 2027              Jeanelle Malling, PA 08/31/22 2037    Edwin Dada P, DO 09/06/22 1529

## 2022-08-31 NOTE — ED Notes (Signed)

## 2022-08-31 NOTE — Discharge Instructions (Addendum)
Please take your medications as prescribed. Take tylenol/ibuprofen for pain. I recommend close follow-up with PCP and neurology for reevaluation.  Please do not hesitate to return to emergency department if worrisome signs symptoms we discussed become apparent.

## 2022-08-31 NOTE — ED Triage Notes (Signed)
Pt c/o migraine, worse behind right ear x 2 week. Pt c/o nausea and photosensitivity.   No relief with tylenol, ibuprofen, hydrocodone.    Pt has hx of complex migraines and aneurysm.
